# Patient Record
Sex: Female | Born: 1970 | Race: White | Hispanic: No | Marital: Married | Smoking: Current every day smoker
Health system: Southern US, Community
[De-identification: ages and names within clinical notes are randomized; demographics above are authoritative.]

## PROBLEM LIST (undated history)

## (undated) DIAGNOSIS — F419 Anxiety disorder, unspecified: Secondary | ICD-10-CM

## (undated) DIAGNOSIS — I10 Essential (primary) hypertension: Secondary | ICD-10-CM

## (undated) DIAGNOSIS — G919 Hydrocephalus, unspecified: Secondary | ICD-10-CM

## (undated) DIAGNOSIS — E7800 Pure hypercholesterolemia, unspecified: Secondary | ICD-10-CM

## (undated) DIAGNOSIS — E119 Type 2 diabetes mellitus without complications: Secondary | ICD-10-CM

## (undated) HISTORY — PX: TONSILLECTOMY: SUR1361

## (undated) HISTORY — DX: Pure hypercholesterolemia, unspecified: E78.00

## (undated) HISTORY — DX: Type 2 diabetes mellitus without complications: E11.9

## (undated) HISTORY — PX: OTHER SURGICAL HISTORY: SHX169

## (undated) HISTORY — DX: Anxiety disorder, unspecified: F41.9

## (undated) HISTORY — PX: CHOLECYSTECTOMY: SHX55

---

## 2001-12-02 ENCOUNTER — Inpatient Hospital Stay (HOSPITAL_COMMUNITY): Admission: AD | Admit: 2001-12-02 | Discharge: 2001-12-05 | Payer: Self-pay | Admitting: Obstetrics and Gynecology

## 2002-01-22 ENCOUNTER — Other Ambulatory Visit: Admission: RE | Admit: 2002-01-22 | Discharge: 2002-01-22 | Payer: Self-pay | Admitting: Obstetrics and Gynecology

## 2004-03-07 ENCOUNTER — Emergency Department (HOSPITAL_COMMUNITY): Admission: EM | Admit: 2004-03-07 | Discharge: 2004-03-07 | Payer: Self-pay | Admitting: *Deleted

## 2005-03-14 ENCOUNTER — Emergency Department (HOSPITAL_COMMUNITY): Admission: EM | Admit: 2005-03-14 | Discharge: 2005-03-14 | Payer: Self-pay | Admitting: Emergency Medicine

## 2005-05-11 ENCOUNTER — Ambulatory Visit (HOSPITAL_COMMUNITY): Admission: AD | Admit: 2005-05-11 | Discharge: 2005-05-11 | Payer: Self-pay | Admitting: Obstetrics and Gynecology

## 2005-06-25 ENCOUNTER — Observation Stay (HOSPITAL_COMMUNITY): Admission: RE | Admit: 2005-06-25 | Discharge: 2005-06-26 | Payer: Self-pay | Admitting: Obstetrics and Gynecology

## 2005-07-02 ENCOUNTER — Inpatient Hospital Stay (HOSPITAL_COMMUNITY): Admission: AD | Admit: 2005-07-02 | Discharge: 2005-07-04 | Payer: Self-pay | Admitting: Obstetrics and Gynecology

## 2008-03-13 ENCOUNTER — Encounter: Payer: Self-pay | Admitting: Obstetrics & Gynecology

## 2008-03-13 ENCOUNTER — Inpatient Hospital Stay (HOSPITAL_COMMUNITY): Admission: RE | Admit: 2008-03-13 | Discharge: 2008-03-16 | Payer: Self-pay | Admitting: Obstetrics & Gynecology

## 2009-04-06 ENCOUNTER — Other Ambulatory Visit: Admission: RE | Admit: 2009-04-06 | Discharge: 2009-04-06 | Payer: Self-pay | Admitting: Obstetrics & Gynecology

## 2010-09-06 DIAGNOSIS — R209 Unspecified disturbances of skin sensation: Secondary | ICD-10-CM | POA: Insufficient documentation

## 2010-10-25 NOTE — Op Note (Signed)
Brittney Reyes, Brittney Reyes               ACCOUNT NO.:  [id number]   MEDICAL RECORD NO.:  [id number]          PATIENT TYPE:  INP   LOCATION:  9107                          FACILITY:  [facility]   PHYSICIAN:  Lazaro Arms, M.D.   DATE OF BIRTH:  August 30, 1970   DATE OF PROCEDURE:  03/13/2008  DATE OF DISCHARGE:                               OPERATIVE REPORT   PREOPERATIVE DIAGNOSES:  1. Intrauterine pregnancy at 51 weeks' gestation.  2. Class A2 diabetes mellitus, on metformin.  3. Previous shoulder dystocia.  4. Estimated fetal weight is 4000 g.  5. Desire sterilization.   POSTOPERATIVE DIAGNOSES:  1. Intrauterine pregnancy at 34 weeks' gestation.  2. Class A2 diabetes mellitus, on metformin.  3. Previous shoulder dystocia.  4. Estimated fetal weight is 4000 g.  5. Desire sterilization.   PROCEDURE:  Primary low-transverse cesarean section.   SURGEON:  Lazaro Arms, MD   ANESTHESIA:  Spinal.   FINDINGS:  Over a low-transverse hysterotomy incision, was delivered a  viable female infant at 83 with Apgars of 8 and 9, weighing 8 pounds 7  ounces, cord gas 7.32.  We did do a tubal ligation.  Uterus, tubes, and  ovaries were normal.   DESCRIPTION OF OPERATION:  The patient was taken to operating room,  placed in a sitting position, where she underwent a spinal anesthetic.  She was then placed in the supine position with a tilt to the left.  She  was prepped and draped in the usual sterile fashion.  A Foley catheter  was placed.  A Pfannenstiel skin incision was made and carried down  sharply into the rectus fascia, which was scored in midline and extended  laterally.  The fascia was taken off the muscles superiorly and  inferiorly without difficulty, the muscles were divided.  The peritoneal  cavity was entered.  A bladder blade was placed.  A vesicouterine  serosal flap was created.  A low-transverse hysterotomy incision was  made over the incision and was delivered a viable female infant at  76  with Apgars of 8 and 9, weighing 8 pounds 7 ounces, and 3-vessel cord.  Cord blood and cord gas were sent.  Cords gas was 7.32.  The placenta  was delivered spontaneously.  The uterus was exteriorized, wiped clean  with a clean lap pad.  The uterus was closed in 2 layers, first being  the running-interlocking layer and the second being imbricating layer.  Modified Pomeroy bilateral tubal ligation was performed bilaterally with  2-cm segments removed bilaterally with good hemostasis.  The uterus was  replaced in peritoneal cavity and both tubal segments removal sites were  found to be hemostatic.  The pelvis was irrigated vigorously.  The  muscles reapproximated loosely.  The fascia was closed using 0-Vicryl  running.  Subcutaneous tissue was made hemostatic and irrigated.  The  skin was closed using 4-0 Vicryl in  subcuticular fashion and Dermabond.  The patient tolerated the procedure  well.  She experienced 750 mL of blood loss, taken to the recovery room  in good stable condition.  All counts  were correct.  She received a gram  of Ancef after the cord was clamped.      Lazaro Arms, M.D.  Electronically Signed     LHE/MEDQ  D:  03/13/2008  T:  03/14/2008  Job:  [id number]

## 2010-10-28 NOTE — Op Note (Signed)
[facility]  Patient:    Brittney Reyes, Brittney Reyes Visit Number: [id number] MRN: [id number]          Service Type: OBS Location: 4A A417 01 Attending Physician:  Tilda Burrow Dictated by:   Zerita Boers, C.N.M. Proc. Date: 12/03/01 Admit Date:  12/02/2001   CC:         [facility]   Operative Report  DELIVERY SUMMARY  ONSET OF LABOR:  December 03, 2001, at 6:18 a.m.  DATE OF DELIVERY:  December 03, 2001, at 11:33 a.m.  LENGTH OF FIRST STAGE OF LABOR:  5 hours and 20 minutes.  LENGTH OF SECOND STAGE OF LABOR:  13 minutes.  LENGTH OF THIRD STAGE OF LABOR:  2 minutes.  DELIVERY NOTE:  The patient had a normal spontaneous vaginal delivery of a viable female infant.  Upon delivery of the head, a nuchal head was noted which was easily loosened and the infant slipped through without any complications. Following delivery of the infant, the infant was active with good tone, picked up well, and had a strong lusty cry with stimulation.  Apgars were 9 and 9. Upon inspection, the perineum was intact.  Pitocin 20 U/ml and D5 LR 1000 cc were run rapidly to actively manage the third stage of labor.  The placenta was delivered spontaneously with a Tomasa Blase mechanism with membranes intact upon inspection.  A three-vessel cord was noted.  The estimated blood loss was approximately 350 cc.  The epidural catheter was removed with the blue tip intact.  The infant and mother tolerated the procedure well.  Both were stabilized and transferred out to the postpartum unit in stable condition. Dictated by:   Zerita Boers, C.N.M. Attending Physician:  Tilda Burrow DD:  12/03/01 TD:  12/03/01 Job: [id number]

## 2010-10-28 NOTE — H&P (Signed)
[facility]  Patient:    Brittney Reyes, Brittney Reyes Visit Number: [id number] MRN: [id number]          Service Type: OBS Location: 4A A417 01 Attending Physician:  Tilda Burrow Dictated by:   Zerita Boers, C.N.M. Admit Date:  12/02/2001   CC:         [facility]   History and Physical  DATE OF BIRTH:  [birth year]  ADMITTING DIAGNOSIS:  Pregnancy at 39 weeks and two days, with maternal fatigue and discomfort; elective induction of labor.  HISTORY OF PRESENT ILLNESS:  [name] is a 40 year old gravida 4 para 3, with a consistent EDC of December 07, 2001 by dates and early ultrasound, who over the past several weeks has been plagued with prodromal labor, insomnia, and maternal fatigue and exhaustion.  She has been requesting induction of labor x2 weeks and I discussed with her at length the risks of induction versus benefits and she has verbalized understanding of this and wishes to proceed with induction of labor.  PAST MEDICAL HISTORY:  Positive for hydrocephaly at age 66.  PAST SURGICAL HISTORY:  Positive for a shunt in 1991 for above.  ALLERGIES:  PROZAC.  MEDICATIONS:  Prenatal vitamins.  SOCIAL HISTORY:  She is married.  Her husband is supportive  FAMILY HISTORY:  Positive for cancer, diabetes, high blood pressure.  PRENATAL COURSE:  Has been complicated by abnormal Pap smear, for which she had colposcopy at 12-14 weeks, and she is to be rechecked three months after delivery.  She also has a history of gestational diabetes but her glucose tolerance tests have been within normal limits.  Blood type is A-negative. Rubella immune.  Hepatitis B surface antigen negative.  HIV negative.  Pap smear was normal.  It showed ascus.  She had colposcopy.  GC and Chlamydia are both negative.  AFP is within normal limits.  She does have a positive GBS status.  PHYSICAL EXAMINATION:  VITAL SIGNS:  Today weight is 213 pounds.  Blood pressure 124/76.   Fetal heart rate is 130, strong, and regular.  Fundal height is 32 cm.  PELVIC:  The cervix is 1 cm thick, posterior, and soft.  PLAN:  We are going to admit her for Foley bulb induction of labor on December 02, 2001 at 5 p.m. Dictated by:   Zerita Boers, C.N.M. Attending Physician:  Tilda Burrow DD:  12/02/01 TD:  12/02/01 Job: [id number]

## 2010-10-28 NOTE — Op Note (Signed)
[facility]  Patient:    DENESE, MENTINK Visit Number: [id number] MRN: [id number]          Service Type: OBS Location: 4A A417 01 Attending Physician:  Tilda Burrow Dictated by:   Christin Bach, M.D. Proc. Date: 12/03/01 Admit Date:  12/02/2001 Discharge Date: 12/05/2001                             Operative Report  PROCEDURE:  Epidural catheter placement.  SURGEON:  Christin Bach, M.D.  TIME:  8:45, December 03, 2001  DESCRIPTION OF PROCEDURE:  The patient had obtained consent, 500 cc lactated Ringers bolus infused, and the patient placed in the sitting position where loss of resistance technique was used after prepping and draping the back. The L3-4 interspace was used on the first try to identify the epidural space using a 19-gauge Tuohy needle.  A 5 cc test dose of 1.5% Xylocaine with epinephrine was instilled followed by a bolus of epidural solution 0.125% Marcaine, also 5 cc in volume.  The continuous infusion was then at 12 cc per hour beginning at 9:15 a.m.  The patient had excellent analgesic relief from this.  Catheter was taped to the back and used in the standard fashion without difficulty. Dictated by:   Christin Bach, M.D. Attending Physician:  Tilda Burrow DD:  12/18/01 TD:  12/22/01 Job: [id number]

## 2010-10-28 NOTE — Discharge Summary (Signed)
Brittney Reyes, Brittney Reyes               ACCOUNT NO.:  [id number]   MEDICAL RECORD NO.:  [id number]          PATIENT TYPE:  INP   LOCATION:  9107                          FACILITY:  [facility]   PHYSICIAN:  Lazaro Arms, M.D.   DATE OF BIRTH:  [birth year]   DATE OF ADMISSION:  03/13/2008  DATE OF DISCHARGE:  03/16/2008                               DISCHARGE SUMMARY   DISCHARGE DIAGNOSES:  1. Status post primary cesarean section and tubal ligation.  2. Unremarkable postoperative course.   PROCEDURES:  Primary cesarean section and tubal ligation.   Please refer to the antepartum chart, operative report, and history and  physical for details of admission to the hospital.   HOSPITAL COURSE:  The patient was admitted for surgery.  She underwent a  primary cesarean section.  She had a previous 4000-g baby and had a  shoulder dystocia which was significant and she was not a diabetic with  that pregnancy.  She was diabetic with this pregnancy.  Estimated fetal  weight was 4000 g.  Fetal vertex was out of the pelvis even though she  had multiple children vaginally and discussed it over with the patient  and her husband.  We decided to pursue with a primary C-section to try  to avoid shoulder dystocia going forward.  The patient understands and  fully agreed for C-section and tubal ligation at patient's request which  went without difficulty.  Her postop course was unremarkable.  She  tolerated clear liquids and regular diet.  She voided without symptoms.  She was extensively ambulatory.  Her abdominal exam was benign.  Her  incision was clean, dry, and intact.  Her hemoglobin and hematocrit  postop were stable, a little lower than anticipated but stable.  She was  tolerating all her pain medicines.  She was discharged to home on postop  day #3 in good stable condition to follow up in the office next week to  have her incision evaluated.  She was given instructions and precautions  to return, Motrin  and Percocet for pain.      Lazaro Arms, M.D.  Electronically Signed     LHE/MEDQ  D:  04/22/2008  T:  04/23/2008  Job:  [id number]

## 2010-10-28 NOTE — H&P (Signed)
NAMEZALIAH, WISSNER               ACCOUNT NO.:  [id number]   MEDICAL RECORD NO.:  [id number]          PATIENT TYPE:  INP   LOCATION:  A403                          FACILITY:  [facility]   PHYSICIAN:  Tilda Burrow, M.D. DATE OF BIRTH:  1971/04/08   DATE OF ADMISSION:  DATE OF DISCHARGE:  LH                                HISTORY & PHYSICAL   ADMISSION DIAGNOSES:  1.  Pregnancy 38 weeks and 3 days.  2.  Medically indicated induction due to transportation concerns with      history of rapid labor.   HISTORY OF PRESENT ILLNESS:  This is a 40 year old female, gravida 5, para  0, four living children, who lives in [place], [place], one hour and  20 minutes from here.  Is admitted on Sunday morning for elective induction  of labor due to history of rapid precipitous labors. She had one baby in 45  minutes from the initiation of contractions.  Cervix is 1-2 cm, 20%, -2 but  she has intermittent pressure.  We have talked about options and with her  schedule and limited family support, Sunday is the best option.  She knows  to call before coming to labor and delivery that morning.  All the usual  risks of labor and delivery including need for emergency delivery can occur  with elective labors and she understands this.   PAST SURGICAL HISTORY:  Benign surgical history.   PAST MEDICAL HISTORY:  1.  Benign other than mildly elevated blood pressure in the third trimester,      currently 130/80 with negative protein.  2.  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   PAST OB HISTORY:  1.  Also notable for history of group B strep positive status with third      child.  2.  She had an epidural with first two.   ALLERGIES:  PROZAC.   MEDICATIONS:  1.  Vitamin D.  2.  Prenatal vitamins.   PHYSICAL EXAMINATION:  VITAL SIGNS:  Height 5 feet 6 inches.  Weight 221.  Blood pressure 130/80.  GENERAL:  Pregnancy  adequately supported by husband.  Family history notable  for mother who has medical  obligations for most of next week.  Term size fetus, vertex presentation.  PELVIC:  Cervix 1-2, 20%, -2, very soft and favorable in texture.   PLAN:  Pitocin induction on Sunday, January 21.      Tilda Burrow, M.D.  Electronically Signed     JVF/MEDQ  D:  06/30/2005  T:  06/30/2005  Job:  [id number]

## 2010-10-28 NOTE — Op Note (Signed)
NAMESHERREL, Brittney Reyes               ACCOUNT NO.:  [id number]   MEDICAL RECORD NO.:  [id number]          PATIENT TYPE:  INP   LOCATION:  A403                          FACILITY:  [facility]   PHYSICIAN:  Tilda Burrow, M.D. DATE OF BIRTH:  11-20-1970   DATE OF PROCEDURE:  07/02/2005  DATE OF DISCHARGE:                                 OPERATIVE REPORT   LABOR SUMMARY AND EPIDURAL NOTE:  Brittney Reyes progressed slowly in labor.  Her  admission was delayed until 9 a.m. due to staffing concerns and business of  labor and delivery.  Upon arrival she was fingertip, very posterior, long  and uneffaced.  Vertex presentation was confirmed.  Amniotomy was performed  after she had been on Pitocin until approximately 3 p.m.  Cervix was only a  finger tip at that point.  She went about 2 hours and then developed  sensation and discomfort and some pelvic pressure.  She was checked and  found to be 5 cm.  Plans for epidural were rapidly initiated.   The patient was placed in the sitting position and continuous lumbar  epidural attempted using loss of resistance technique.  On the second  attempt at L2-3 interspace we were able to identify the epidural space.  The  patient was quite uncomfortable with the contractions and in the sitting  position made things worse.  She developed an urge to push so as soon as we  were able to give the initial 7 mL test dose of 1.5% lidocaine with  epinephrine, the patient had the needle removed.  We did not insert the  catheter, the patient was placed in the supine position and confirmed as  being completely dilated.   DELIVERY NOTE:  The patient then progressed to initially quite  uncomfortable, but she became slightly more comfortable as the epidural  effect improved.  She then was able to push the baby out in approximately 10  minutes delivering over a direct occiput anterior, delivering a healthy,  female infant, 8 pounds 0.6 ounces.  Apgars of 8 and 9.  The placenta  was  delivered easily, Tomasa Blase presentation after cord blood samples were  obtained.  The patient did have one episode of uterine atony, and had an  additional 400 mL of blood loss, total was estimated at probably 600-800 mL  total. The patient remained hemodynamically stable, thereafter received  Hemabate 125 mcg intramuscular.  She received IV oxytocin x1 liter over a 2  hour infusion.  Uterine tone remained excellent at U minus 3. There were no  lacerations requiring repair.      Tilda Burrow, M.D.  Electronically Signed     JVF/MEDQ  D:  07/02/2005  T:  07/03/2005  Job:  [id number]   cc:   [facility]  [place]   [place]  [id number]

## 2011-03-14 LAB — COMPREHENSIVE METABOLIC PANEL
ALT: 13
AST: 16
Calcium: 8.4
Creatinine, Ser: 0.6
GFR calc Af Amer: >60
Sodium: 136
Total Protein: 6.5

## 2011-03-14 LAB — GLUCOSE, CAPILLARY
Glucose-Capillary: 124 — ABNORMAL HIGH
Glucose-Capillary: 147 — ABNORMAL HIGH
Glucose-Capillary: 155 — ABNORMAL HIGH
Glucose-Capillary: 78
Glucose-Capillary: 86

## 2011-03-14 LAB — CBC
HCT: 26.5 — ABNORMAL LOW
MCHC: 32.8
MCHC: 33.3
MCV: 98.1
MCV: 98.2
Platelets: 222
RDW: 16.1 — ABNORMAL HIGH

## 2011-03-14 LAB — RPR: RPR Ser Ql: NONREACTIVE

## 2011-03-14 LAB — RH IMMUNE GLOB WKUP(>/=20WKS)(NOT WOMEN'S HOSP)

## 2011-08-28 DIAGNOSIS — G911 Obstructive hydrocephalus: Secondary | ICD-10-CM | POA: Insufficient documentation

## 2011-09-25 DIAGNOSIS — H539 Unspecified visual disturbance: Secondary | ICD-10-CM | POA: Insufficient documentation

## 2011-09-25 DIAGNOSIS — R519 Headache, unspecified: Secondary | ICD-10-CM | POA: Insufficient documentation

## 2012-03-25 ENCOUNTER — Other Ambulatory Visit (HOSPITAL_COMMUNITY)
Admission: RE | Admit: 2012-03-25 | Discharge: 2012-03-25 | Disposition: A | Discharge status: Home / Self Care | Payer: Medicaid Other | Source: Ambulatory Visit | Attending: Obstetrics & Gynecology | Admitting: Obstetrics & Gynecology

## 2012-03-25 ENCOUNTER — Other Ambulatory Visit: Payer: Self-pay | Admitting: Obstetrics & Gynecology

## 2012-03-25 DIAGNOSIS — Z01419 Encounter for gynecological examination (general) (routine) without abnormal findings: Secondary | ICD-10-CM | POA: Insufficient documentation

## 2013-05-15 ENCOUNTER — Encounter: Payer: Self-pay | Admitting: Obstetrics & Gynecology

## 2013-05-15 ENCOUNTER — Other Ambulatory Visit (HOSPITAL_COMMUNITY)
Admission: RE | Admit: 2013-05-15 | Discharge: 2013-05-15 | Disposition: A | Discharge status: Home / Self Care | Payer: Medicaid Other | Source: Ambulatory Visit | Attending: Obstetrics & Gynecology | Admitting: Obstetrics & Gynecology

## 2013-05-15 ENCOUNTER — Ambulatory Visit (INDEPENDENT_AMBULATORY_CARE_PROVIDER_SITE_OTHER): Payer: Medicaid Other | Admitting: Obstetrics & Gynecology

## 2013-05-15 VITALS — BP 120/70 | Ht 66.2 in | Wt 176.0 lb

## 2013-05-15 DIAGNOSIS — F329 Major depressive disorder, single episode, unspecified: Secondary | ICD-10-CM

## 2013-05-15 DIAGNOSIS — Z124 Encounter for screening for malignant neoplasm of cervix: Secondary | ICD-10-CM

## 2013-05-15 DIAGNOSIS — Z01419 Encounter for gynecological examination (general) (routine) without abnormal findings: Secondary | ICD-10-CM | POA: Insufficient documentation

## 2013-05-15 DIAGNOSIS — Z1151 Encounter for screening for human papillomavirus (HPV): Secondary | ICD-10-CM | POA: Insufficient documentation

## 2013-05-15 DIAGNOSIS — I10 Essential (primary) hypertension: Secondary | ICD-10-CM | POA: Insufficient documentation

## 2013-05-15 DIAGNOSIS — Z0000 Encounter for general adult medical examination without abnormal findings: Secondary | ICD-10-CM

## 2013-05-15 MED ORDER — HYDROCORTISONE ACE-PRAMOXINE 1-1 % RE FOAM: 1.0000 | Freq: Two times a day (BID) | RECTAL | Status: DC

## 2013-05-15 NOTE — Progress Notes (Signed)
Patient ID: Brittney Reyes, female   DOB: 12/06/1970, 42 y.o.   MRN: [id number] Subjective:     Brittney Reyes is a 42 y.o. female here for a routine exam.  Patient's last menstrual period was 04/22/2013. No obstetric history on file. Birth Control Method:  btl Menstrual Calendar(currently): regular 4 days, moderate minimal cramps  Current complaints: none.   Current acute medical issues:  none   Recent Gynecologic History Patient's last menstrual period was 04/22/2013. Last Pap: 2013,  normal Last mammogram: na,  na  Past Medical History  Diagnosis Date  . Anxiety     Past Surgical History  Procedure Laterality Date  . Cesarean section    . Leep surgery    . Cholecystectomy    . Shut in stomach      OB History   Grav Para Term Preterm Abortions TAB SAB Ect Mult Living                  History   Social History  . Marital Status: Married    Spouse Name: N/A    Number of Children: N/A  . Years of Education: N/A   Social History Main Topics  . Smoking status: Current Every Day Smoker  . Smokeless tobacco: None  . Alcohol Use: None  . Drug Use: None  . Sexual Activity: None   Other Topics Concern  . None   Social History Narrative  . None    Family History  Problem Relation Age of Onset  . Diabetes Mother   . Hypertension Mother   . Hypertension Father   . Migraines Daughter   . Asthma Child      Review of Systems  Review of Systems  Constitutional: Negative for fever, chills, weight loss, malaise/fatigue and diaphoresis.  HENT: Negative for hearing loss, ear pain, nosebleeds, congestion, sore throat, neck pain, tinnitus and ear discharge.   Eyes: Negative for blurred vision, double vision, photophobia, pain, discharge and redness.  Respiratory: Negative for cough, hemoptysis, sputum production, shortness of breath, wheezing and stridor.   Cardiovascular: Negative for chest pain, palpitations, orthopnea, claudication, leg swelling and PND.   Gastrointestinal: negative for abdominal pain. Negative for heartburn, nausea, vomiting, diarrhea, constipation, blood in stool and melena.  Genitourinary: Negative for dysuria, urgency, frequency, hematuria and flank pain.  Musculoskeletal: Negative for myalgias, back pain, joint pain and falls.  Skin: Negative for itching and rash.  Neurological: Negative for dizziness, tingling, tremors, sensory change, speech change, focal weakness, seizures, loss of consciousness, weakness and headaches.  Endo/Heme/Allergies: Negative for environmental allergies and polydipsia. Does not bruise/bleed easily.  Psychiatric/Behavioral: Negative for depression, suicidal ideas, hallucinations, memory loss and substance abuse. The patient is not nervous/anxious and does not have insomnia.        Objective:    Physical Exam  Vitals reviewed. Constitutional: She is oriented to person, place, and time. She appears well-developed and well-nourished.  HENT:  Head: Normocephalic and atraumatic.        Right Ear: External ear normal.  Left Ear: External ear normal.  Nose: Nose normal.  Mouth/Throat: Oropharynx is clear and moist.  Eyes: Conjunctivae and EOM are normal. Pupils are equal, round, and reactive to light. Right eye exhibits no discharge. Left eye exhibits no discharge. No scleral icterus.  Neck: Normal range of motion. Neck supple. No tracheal deviation present. No thyromegaly present.  Cardiovascular: Normal rate, regular rhythm, normal heart sounds and intact distal pulses.  Exam reveals no gallop and no  friction rub.   No murmur heard. Respiratory: Effort normal and breath sounds normal. No respiratory distress. She has no wheezes. She has no rales. She exhibits no tenderness.  GI: Soft. Bowel sounds are normal. She exhibits no distension and no mass. There is no tenderness. There is no rebound and no guarding.  Genitourinary:  Breasts no masses skin changes or nipple changes bilaterally       Vulva is normal without lesions Vagina is pink moist without discharge Cervix normal in appearance and pap is done Uterus is normal size shape and contour Adnexa is negative with normal sized ovaries   Musculoskeletal: Normal range of motion. She exhibits no edema and no tenderness.  Neurological: She is alert and oriented to person, place, and time. She has normal reflexes. She displays normal reflexes. No cranial nerve deficit. She exhibits normal muscle tone. Coordination normal.  Skin: Skin is warm and dry. No rash noted. No erythema. No pallor.  Psychiatric: She has a normal mood and affect. Her behavior is normal. Judgment and thought content normal.       Assessment:    Healthy female exam.    Plan:    Follow up in: 1 years.

## 2013-06-21 ENCOUNTER — Emergency Department (HOSPITAL_COMMUNITY): Payer: Medicaid Other

## 2013-06-21 ENCOUNTER — Emergency Department (HOSPITAL_COMMUNITY)
Admission: EM | Admit: 2013-06-21 | Discharge: 2013-06-21 | Disposition: A | Discharge status: Home / Self Care | Payer: Medicaid Other | Attending: Emergency Medicine | Admitting: Emergency Medicine

## 2013-06-21 ENCOUNTER — Encounter (HOSPITAL_COMMUNITY): Payer: Self-pay | Admitting: Emergency Medicine

## 2013-06-21 DIAGNOSIS — F411 Generalized anxiety disorder: Secondary | ICD-10-CM | POA: Insufficient documentation

## 2013-06-21 DIAGNOSIS — Z3202 Encounter for pregnancy test, result negative: Secondary | ICD-10-CM | POA: Insufficient documentation

## 2013-06-21 DIAGNOSIS — R109 Unspecified abdominal pain: Secondary | ICD-10-CM | POA: Insufficient documentation

## 2013-06-21 DIAGNOSIS — F17200 Nicotine dependence, unspecified, uncomplicated: Secondary | ICD-10-CM | POA: Insufficient documentation

## 2013-06-21 DIAGNOSIS — I10 Essential (primary) hypertension: Secondary | ICD-10-CM | POA: Insufficient documentation

## 2013-06-21 DIAGNOSIS — Z79899 Other long term (current) drug therapy: Secondary | ICD-10-CM | POA: Insufficient documentation

## 2013-06-21 DIAGNOSIS — Z8669 Personal history of other diseases of the nervous system and sense organs: Secondary | ICD-10-CM | POA: Insufficient documentation

## 2013-06-21 HISTORY — DX: Hydrocephalus, unspecified: G91.9

## 2013-06-21 HISTORY — DX: Essential (primary) hypertension: I10

## 2013-06-21 LAB — CBC WITH DIFFERENTIAL/PLATELET
BASOS ABS: 0 10*3/uL (ref 0.0–0.1)
BASOS PCT: 0 % (ref 0–1)
EOS ABS: 0 10*3/uL (ref 0.0–0.7)
EOS PCT: 0 % (ref 0–5)
HCT: 35.6 % — ABNORMAL LOW (ref 36.0–46.0)
Hemoglobin: 11.8 g/dL — ABNORMAL LOW (ref 12.0–15.0)
LYMPHS PCT: 29 % (ref 12–46)
Lymphs Abs: 2.3 10*3/uL (ref 0.7–4.0)
MCH: 29.3 pg (ref 26.0–34.0)
MCHC: 33.1 g/dL (ref 30.0–36.0)
MCV: 88.3 fL (ref 78.0–100.0)
Monocytes Absolute: 0.4 10*3/uL (ref 0.1–1.0)
Monocytes Relative: 5 % (ref 3–12)
Neutro Abs: 5 10*3/uL (ref 1.7–7.7)
Neutrophils Relative %: 65 % (ref 43–77)
PLATELETS: 404 10*3/uL — AB (ref 150–400)
RBC: 4.03 MIL/uL (ref 3.87–5.11)
RDW: 14.5 % (ref 11.5–15.5)
WBC: 7.7 10*3/uL (ref 4.0–10.5)

## 2013-06-21 LAB — HCG, QUANTITATIVE, PREGNANCY

## 2013-06-21 LAB — COMPREHENSIVE METABOLIC PANEL
ALBUMIN: 4.5 g/dL (ref 3.5–5.2)
ALT: 9 U/L (ref 0–35)
AST: 11 U/L (ref 0–37)
Alkaline Phosphatase: 76 U/L (ref 39–117)
BUN: 6 mg/dL (ref 6–23)
CALCIUM: 9.5 mg/dL (ref 8.4–10.5)
CO2: 26 meq/L (ref 19–32)
Chloride: 99 mEq/L (ref 96–112)
Creatinine, Ser: 0.54 mg/dL (ref 0.50–1.10)
GFR calc Af Amer: >90 mL/min (ref >90)
GFR calc non Af Amer: >90 mL/min (ref >90)
Glucose, Bld: 106 mg/dL — ABNORMAL HIGH (ref 70–99)
Potassium: 4.4 mEq/L (ref 3.7–5.3)
SODIUM: 138 meq/L (ref 137–147)
TOTAL PROTEIN: 8.1 g/dL (ref 6.0–8.3)
Total Bilirubin: 0.2 mg/dL — ABNORMAL LOW (ref 0.3–1.2)

## 2013-06-21 LAB — URINALYSIS, ROUTINE W REFLEX MICROSCOPIC
BILIRUBIN URINE: NEGATIVE
GLUCOSE, UA: NEGATIVE mg/dL
HGB URINE DIPSTICK: NEGATIVE
Ketones, ur: NEGATIVE mg/dL
Leukocytes, UA: NEGATIVE
Nitrite: NEGATIVE
PH: 7 (ref 5.0–8.0)
Protein, ur: NEGATIVE mg/dL
Urobilinogen, UA: 0.2 mg/dL (ref 0.0–1.0)

## 2013-06-21 LAB — PREGNANCY, URINE: PREG TEST UR: NEGATIVE

## 2013-06-21 MED ORDER — SODIUM CHLORIDE 0.9 % IV BOLUS (SEPSIS)
500.0000 mL | Freq: Once | INTRAVENOUS | Status: AC
  Administered 2013-06-21: 500 mL via INTRAVENOUS

## 2013-06-21 MED ORDER — DICYCLOMINE HCL 20 MG PO TABS: ORAL_TABLET | ORAL | Status: DC

## 2013-06-21 MED ORDER — HYDROMORPHONE HCL PF 1 MG/ML IJ SOLN
0.5000 mg | Freq: Once | INTRAMUSCULAR | Status: AC
  Administered 2013-06-21: 0.5 mg via INTRAVENOUS
  Filled 2013-06-21: qty 1

## 2013-06-21 MED ORDER — ONDANSETRON HCL 4 MG/2ML IJ SOLN
4.0000 mg | Freq: Once | INTRAMUSCULAR | Status: AC
Start: 2013-06-21 — End: 2013-06-21
  Administered 2013-06-21: 4 mg via INTRAVENOUS
  Filled 2013-06-21: qty 2

## 2013-06-21 MED ORDER — IOHEXOL 300 MG/ML  SOLN
100.0000 mL | Freq: Once | INTRAMUSCULAR | Status: AC | PRN
  Administered 2013-06-21: 100 mL via INTRAVENOUS

## 2013-06-21 MED ORDER — IOHEXOL 300 MG/ML  SOLN
50.0000 mL | Freq: Once | INTRAMUSCULAR | Status: AC | PRN
  Administered 2013-06-21: 50 mL via ORAL

## 2013-06-21 NOTE — Discharge Instructions (Signed)
Follow up with dr. Jena Gaussrourk in 1-2 weeks

## 2013-06-21 NOTE — ED Notes (Signed)
Returned from CT.

## 2013-06-21 NOTE — ED Notes (Addendum)
Patient completed Omnipaque. CT notified.

## 2013-06-21 NOTE — ED Provider Notes (Signed)
CSN: [id number]     Arrival date & time 06/21/13  1118 History  This chart was scribed for Benny LennertJoseph L Oliviarose Punch, MD by Bennett Scrapehristina Taylor, ED Scribe. This patient was seen in room APA09/APA09 and the patient's care was started at 12:11 PM.   Chief Complaint  Patient presents with  . Abdominal Pain    Patient is a 43 y.o. female presenting with flank pain. The history is provided by the patient. No language interpreter was used.  Flank Pain This is a new problem. The current episode started more than 1 week ago. The problem occurs constantly. The problem has been gradually worsening. Associated symptoms include abdominal pain. Pertinent negatives include no chest pain, no headaches and no shortness of breath. Nothing aggravates the symptoms. Nothing relieves the symptoms. She has tried nothing for the symptoms.    HPI Comments: Brittney Reyes is a 43 y.o. female who presents to the Emergency Department complaining of 3 weeks of right flank pain that radiates into the right abdomen with associated nausea. She denies any emesis. She states that she saw Dr. Lanier PrudeBolden this morning for the same and was told to come to the ED due to the possibility of "appendicitis or a tubal pregnancy". A urine pregnancy test was performed in office that was negative. Pt does report having a false negative during her last pregnancy during which she had to take progesterone.    Past Medical History  Diagnosis Date  . Anxiety   . Hydrocephalus   . Hypertension    Past Surgical History  Procedure Laterality Date  . Cesarean section    . Leep surgery    . Cholecystectomy    . 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 Tonsillectomy     Family History  Problem Relation Age of Onset  . Diabetes Mother   . Hypertension Mother   . Hypertension Father   . Migraines Daughter   . Asthma Child    History  Substance Use Topics  . Smoking status: Current Every Day Smoker -- 0.50 packs/day for 25 years    Types:  Cigarettes  . Smokeless tobacco: Never Used  . Alcohol Use: No   No OB history provided.  Review of Systems  Constitutional: Negative for fever and appetite change.  HENT: Negative for congestion, ear discharge and sinus pressure.   Eyes: Negative for discharge.  Respiratory: Negative for shortness of breath.   Cardiovascular: Negative for chest pain.  Gastrointestinal: Positive for nausea and abdominal pain. Negative for vomiting.  Genitourinary: Positive for flank pain. Negative for frequency.  Musculoskeletal: Negative for back pain.  Skin: Negative for rash.  Neurological: Negative for seizures and headaches.  Psychiatric/Behavioral: Negative for hallucinations.    Allergies  Prozac and Wellbutrin  Home Medications   Current Outpatient Rx  Name  Route  Sig  Dispense  Refill  . amLODipine (NORVASC) 5 MG tablet   Oral   Take 5 mg by mouth daily.         . calcium-vitamin D (OSCAL WITH D) 500-200 MG-UNIT per tablet   Oral   Take 2,000 tablets by mouth.         . hydrocortisone-pramoxine (PROCTOFOAM HC) rectal foam   Rectal   Place 1 applicator rectally 2 (two) times daily.   10 g   11   . sertraline (ZOLOFT) 100 MG tablet   Oral   Take 100 mg by mouth daily.  Triage Vitals: BP 166/100  Pulse 63  Temp(Src) 97.7 F (36.5 C) (Oral)  Resp 18  Ht 5' 6.5" (1.689 m)  Wt 175 lb (79.379 kg)  BMI 27.83 kg/m2  SpO2 100%  LMP 04/23/2013  Physical Exam  Nursing note and vitals reviewed. Constitutional: She is oriented to person, place, and time. She appears well-developed and well-nourished.  HENT:  Head: Normocephalic and atraumatic.  Eyes: Conjunctivae and EOM are normal. No scleral icterus.  Neck: Neck supple. No thyromegaly present.  Cardiovascular: Normal rate and regular rhythm.  Exam reveals no gallop and no friction rub.   No murmur heard. Pulmonary/Chest: Effort normal and breath sounds normal. No stridor. She has no wheezes. She has no  rales. She exhibits no tenderness.  Abdominal: Soft. She exhibits no distension. There is tenderness (diffuse moderate tenderness, worse in the RUQ and RLQ). There is no rebound.  Musculoskeletal: Normal range of motion. She exhibits no edema.  Lymphadenopathy:    She has no cervical adenopathy.  Neurological: She is alert and oriented to person, place, and time. She exhibits normal muscle tone. Coordination normal.  Skin: Skin is warm and dry. No rash noted. No erythema.  Psychiatric: She has a normal mood and affect. Her behavior is normal.    ED Course  Procedures (including critical care time)  Medications  ondansetron (ZOFRAN) injection 4 mg (not administered)  sodium chloride 0.9 % bolus 500 mL (not administered)  HYDROmorphone (DILAUDID) injection 0.5 mg (not administered)    DIAGNOSTIC STUDIES: Oxygen Saturation is 100% on RA, normal by my interpretation.    COORDINATION OF CARE: 12:15 PM-Discussed treatment plan which includes medications, CT of abdomen, CBC panel, CMP and UA with pt at bedside and pt agreed to plan.   Labs Review Labs Reviewed - No data to display Imaging Review No results found.  EKG Interpretation   None       MDM  abd pain,  Nl studies, refer to gi The chart was scribed for me under my direct supervision.  I personally performed the history, physical, and medical decision making and all procedures in the evaluation of this patient.Benny Lennert, MD 06/21/13 [phone]

## 2013-06-21 NOTE — ED Notes (Signed)
Pt complaining of abdominal pain along with right flank pain x 3 weeks. Saw Dr. Lanier PrudeBolden this morning, told to come to emergency department for evaluation for tubal pregnancy or appendicitis. Negative urine pregnancy test, afebrile.

## 2013-07-01 ENCOUNTER — Ambulatory Visit (INDEPENDENT_AMBULATORY_CARE_PROVIDER_SITE_OTHER): Payer: Medicaid Other | Admitting: Obstetrics and Gynecology

## 2013-07-01 ENCOUNTER — Encounter: Payer: Self-pay | Admitting: Obstetrics and Gynecology

## 2013-07-01 VITALS — BP 158/60 | Ht 66.5 in | Wt 178.4 lb

## 2013-07-01 DIAGNOSIS — Z3202 Encounter for pregnancy test, result negative: Secondary | ICD-10-CM

## 2013-07-01 DIAGNOSIS — N898 Other specified noninflammatory disorders of vagina: Secondary | ICD-10-CM

## 2013-07-01 LAB — POCT WET PREP (WET MOUNT)
POCT FERN TEST: POSITIVE
TRICHOMONAS WET PREP HPF POC: NEGATIVE

## 2013-07-01 LAB — POCT URINE PREGNANCY: PREG TEST UR: NEGATIVE

## 2013-07-01 MED ORDER — MEDROXYPROGESTERONE ACETATE 10 MG PO TABS: 10.0000 mg | ORAL_TABLET | Freq: Every day | ORAL | Status: DC

## 2013-07-01 NOTE — Progress Notes (Signed)
Patient ID: Brittney HardingMichelle L Reyes, female   DOB: 08/10/1970, 43 y.o.   MRN: [id number]  Chief Complaint  Patient presents with  . Abdominal Pain    HPI Brittney HardingMichelle L Reyes is a 43 y.o. female.  She is seen here to sort out confusion regarding recent [facility] which suggested that she has thickened endometrium, and multiple small follicular cysts. Differential diagnosis was extensive and included molar pregnancy which caused anxiety the patient Abdominal Pain   LMP November Past Medical History  Diagnosis Date  . Anxiety   . Hydrocephalus   . Hypertension     Past Surgical History  Procedure Laterality Date  . Cesarean section    . Leep surgery    . Cholecystectomy    . 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 Tonsillectomy      Family History  Problem Relation Age of Onset  . Diabetes Mother   . Hypertension Mother   . Hypertension Father   . Migraines Daughter   . Asthma Child     Social History History  Substance Use Topics  . Smoking status: Current Every Day Smoker -- 0.50 packs/day for 25 years    Types: Cigarettes  . Smokeless tobacco: Never Used  . Alcohol Use: No    Allergies  Allergen Reactions  . Prozac [Fluoxetine Hcl] Nausea And Vomiting and Swelling  . Wellbutrin [Bupropion] Hives    Current Outpatient Prescriptions  Medication Sig Dispense Refill  . acetaminophen (TYLENOL) 500 MG tablet Take 1,000 mg by mouth daily as needed for headache.      Marland Kitchen. amLODipine (NORVASC) 5 MG tablet Take 5 mg by mouth daily.      . Cholecalciferol (HM VITAMIN D3) 2000 UNITS CAPS Take 1 capsule by mouth daily.      Marland Kitchen. dicyclomine (BENTYL) 20 MG tablet Take one every 6-8h as needed for abd cramps  30 tablet  0  . hydrocortisone-pramoxine (PROCTOFOAM HC) rectal foam Place 1 applicator rectally 2 (two) times daily.  10 g  11  . ibuprofen (ADVIL,MOTRIN) 200 MG tablet Take 800 mg by mouth daily as needed for headache.      . sertraline  (ZOLOFT) 100 MG tablet Take 50 mg by mouth daily.        No current facility-administered medications for this visit.    Review of Systems Review of Systems  Gastrointestinal: Positive for abdominal pain.    Blood pressure 158/60, height 5' 6.5" (1.689 m), weight 178 lb 6.4 oz (80.922 kg), last menstrual period 04/23/2013.  Physical Exam Physical Exam  Constitutional: She is oriented to person, place, and time. She appears well-developed and well-nourished.  Eyes: Pupils are equal, round, and reactive to light.  Neck: Neck supple. No thyromegaly present.  Abdominal: Soft. She exhibits no mass. There is no tenderness. There is no rebound.  Genitourinary: Vagina normal and uterus normal.  Cervical mucus is thin and watery and on fern test shows classic ferning which indicates anovulation  Musculoskeletal: Normal range of motion.  Neurological: She is alert and oriented to person, place, and time. She has normal reflexes.  Psychiatric: She has a normal mood and affect. Her behavior is normal. Judgment and thought content normal.    Data Reviewed Fern test positive for anovulation  Assessment    1 anovulation 2 amenorrhea due to anovulation. Normal functional ovarian cysts Plan: Provera x10 days 10 mg to withdraw and cause menses  Plan    As in assessment.        Kirubel Aja V 07/01/2013, 3:00 PM

## 2013-07-01 NOTE — Patient Instructions (Signed)
Take provera x 10 day, expect a period 2 days later.

## 2013-09-24 DIAGNOSIS — O24419 Gestational diabetes mellitus in pregnancy, unspecified control: Secondary | ICD-10-CM | POA: Insufficient documentation

## 2014-05-10 IMAGING — CT CT ABD-PELV W/ CM
2 of 4 series · 16 of 46 positions shown, 18 images · IV contrast (omnipaque)
Comparison: None.

CLINICAL DATA: Right side abdominal pain

EXAM:
CT ABDOMEN AND PELVIS WITH CONTRAST
TECHNIQUE: Multidetector CT imaging of the abdomen and pelvis was performed
using the standard protocol following bolus administration of
intravenous contrast.
CONTRAST:  50mL OMNIPAQUE IOHEXOL 300 MG/ML SOLN, 100mL OMNIPAQUE
IOHEXOL 300 MG/ML SOLN

[Series 2: abd_pel_with 5.0 b40f · axial · 0.72mm/px · z∈[-480,-50]mm · 13 of 96 slices shown, 15 images]
[im 5/96  soft-tissue]
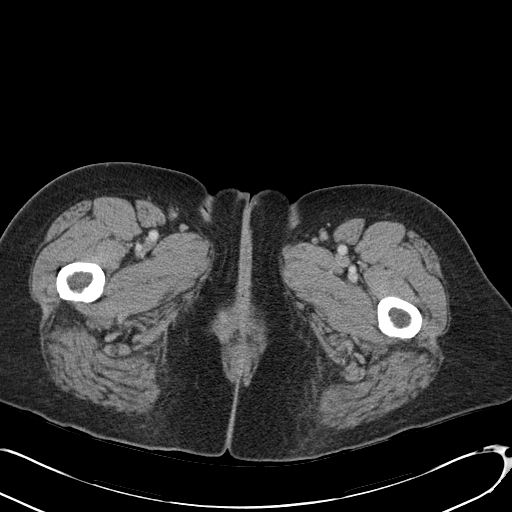
[im 5/96  bone]
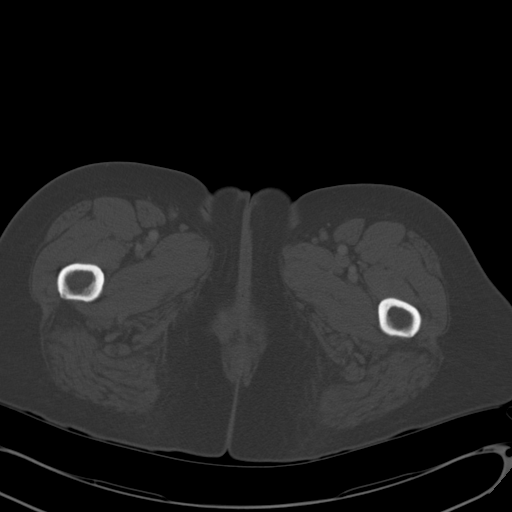
[im 14/96  soft-tissue]
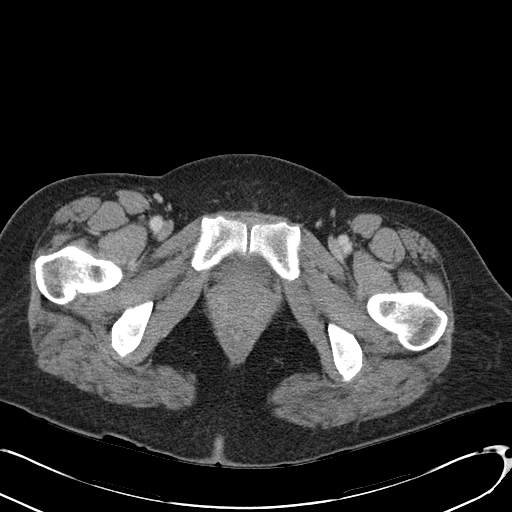
[im 19/96  soft-tissue]
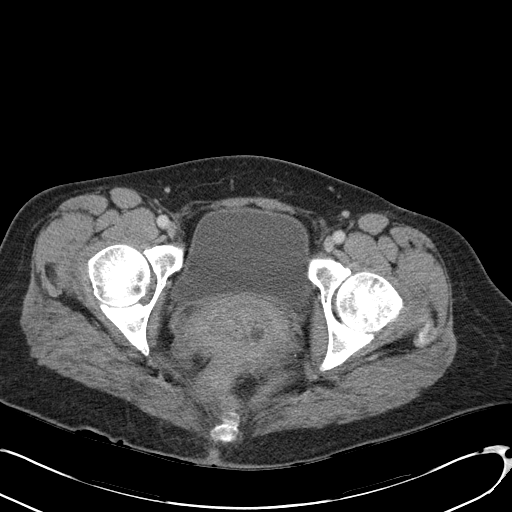
[im 28/96  soft-tissue]
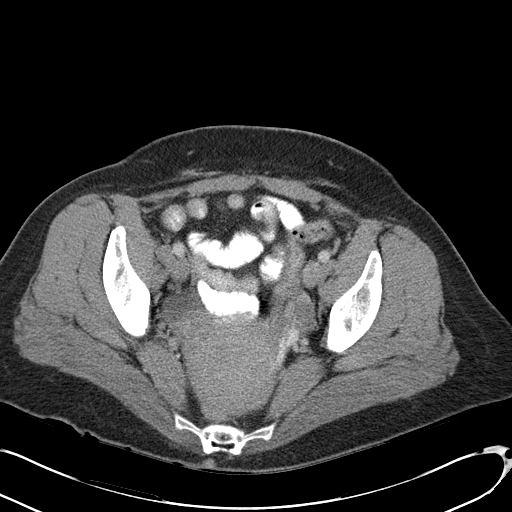
[im 32/96  soft-tissue]
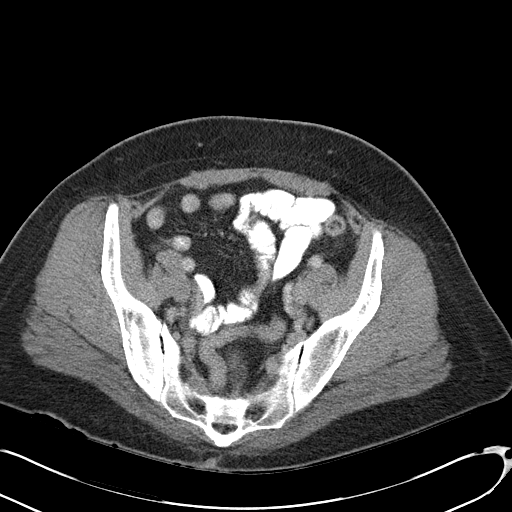
[im 41/96  soft-tissue]
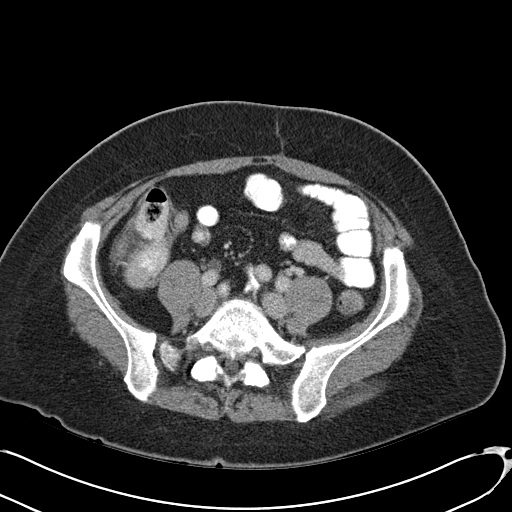
[im 50/96  soft-tissue]
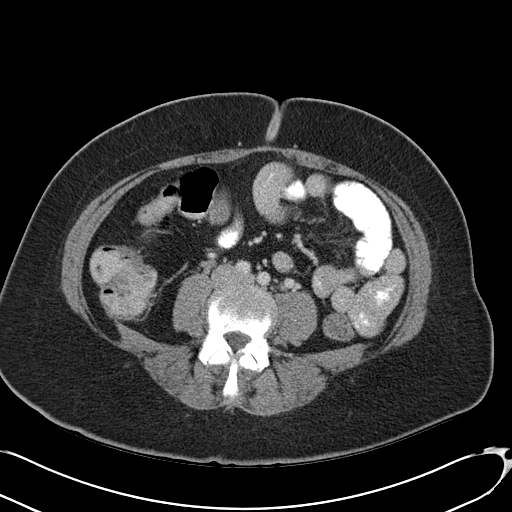
[im 55/96  soft-tissue]
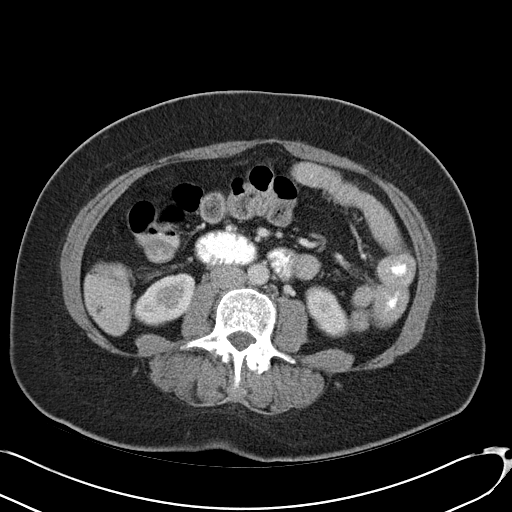
[im 64/96  soft-tissue]
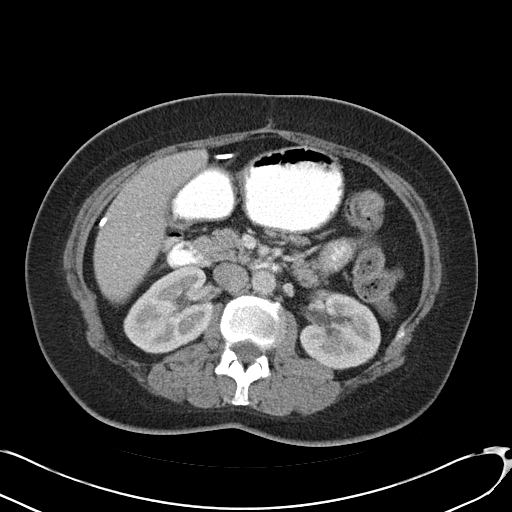
[im 64/96  bone]
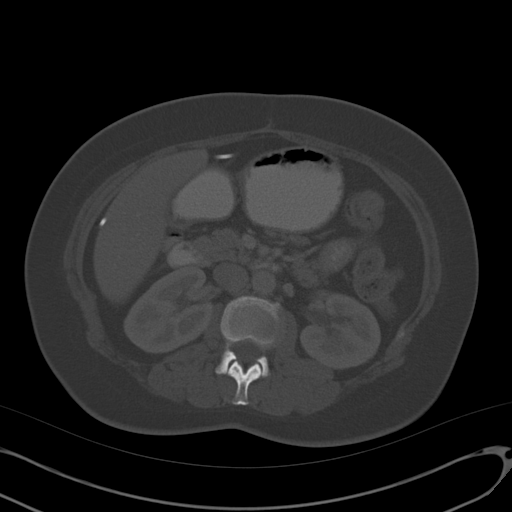
[im 68/96  soft-tissue]
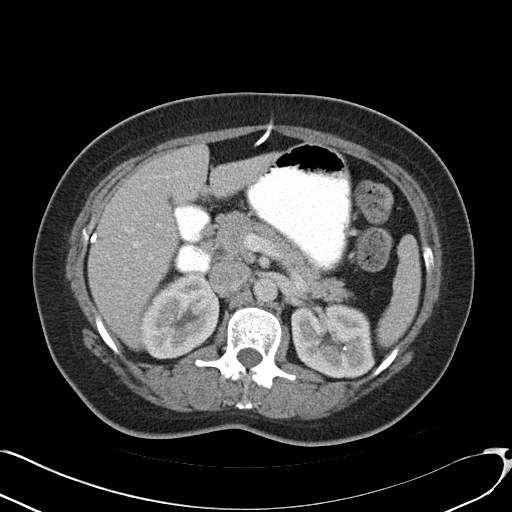
[im 77/96  soft-tissue]
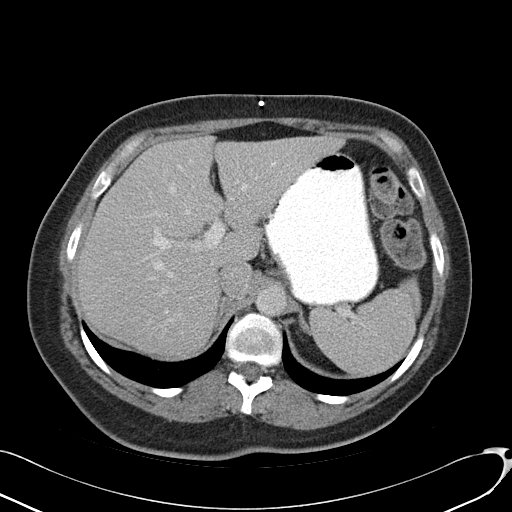
[im 82/96  soft-tissue]
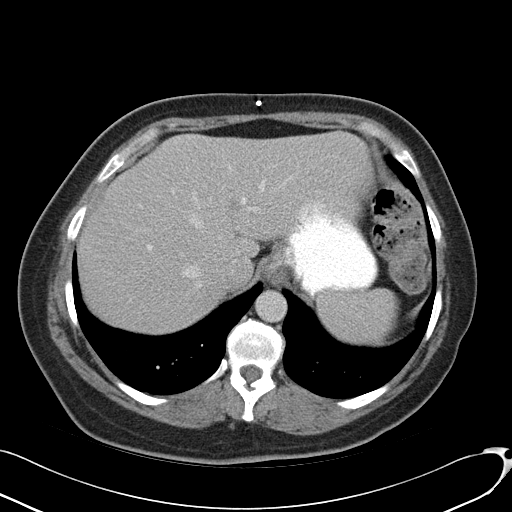
[im 91/96  soft-tissue]
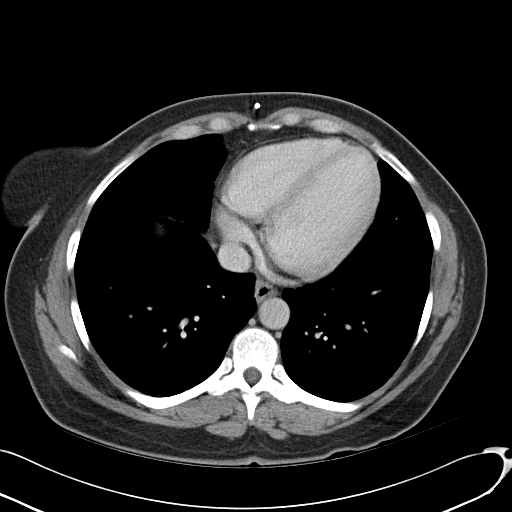

[Series 4: abd_pel_with 3.0 spo cor · coronal · 0.73mm/px · 3 of 92 slices shown]
[im 31/92  soft-tissue]
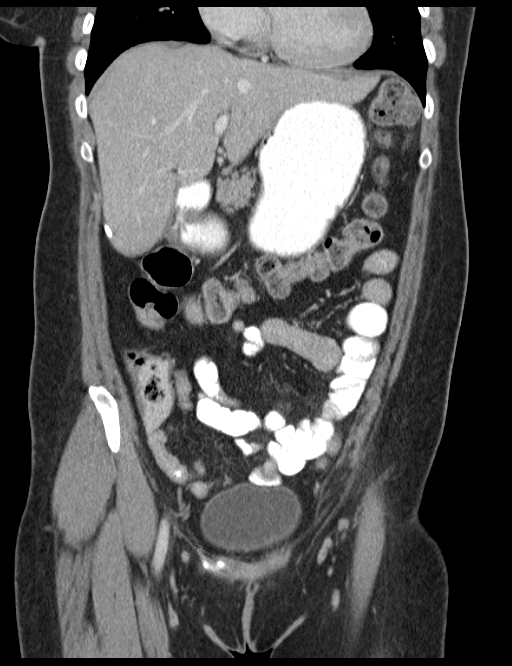
[im 41/92  soft-tissue]
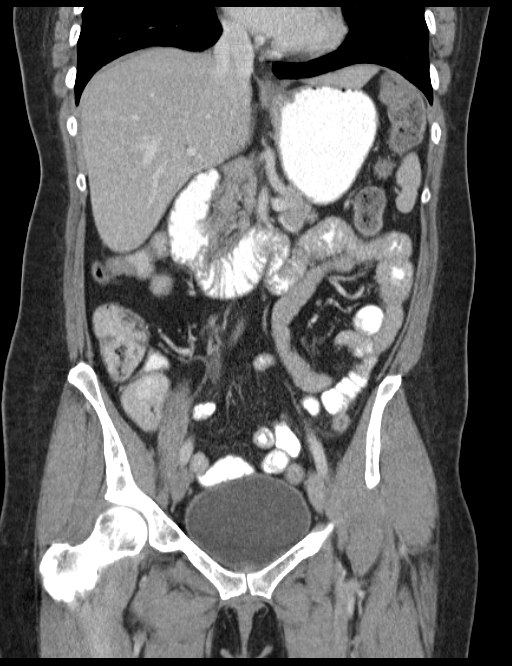
[im 51/92  soft-tissue]
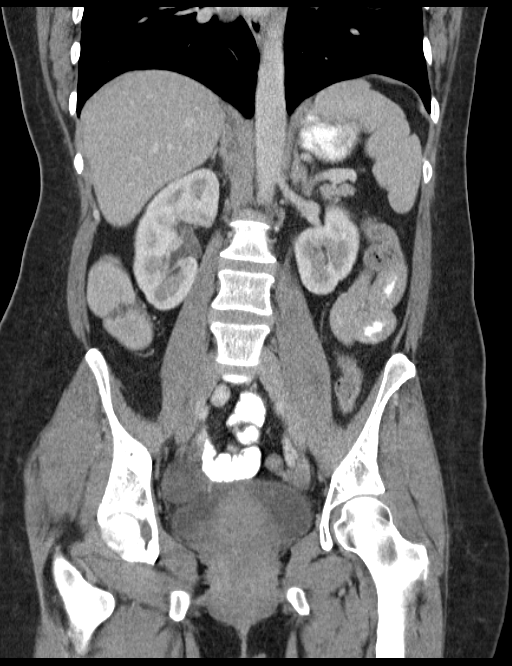

[16 of 46 positions shown; findings below may reference images not displayed]

FINDINGS: Lung bases are unremarkable. Sagittal images of the spine shows no
destructive bony lesions. A right VP shunt catheter is noted. Liver,
pancreas, spleen and adrenals are unremarkable. Kidneys are
symmetrical in size and enhancement. No hydronephrosis or
hydroureter. At least 2 punctate nonobstructive calcified calculi
are noted within right kidney the largest in midpole measures
mm. At least 3 nonobstructive calcified calculi are noted within
left kidney the largest in midpole measures 3 mm.

No calcified ureteral calculi are noted bilaterally.

No small bowel obstruction.  No ascites or free air.  No adenopathy.

Nonspecific minimal thickening of the wall of jejunal loops.
Nonspecific mild enteritis cannot be excluded.

There is a retroflexed uterus. No pericecal inflammation. The
terminal ileum is unremarkable.

The appendix is not identified. Minimal congested left adnexal
vessels. The urinary bladder is unremarkable.
IMPRESSION: 1. Bilateral nonobstructive nephrolithiasis. No hydronephrosis or
hydroureter.
2. No pericecal inflammation.  No small bowel obstruction.
3. Nonspecific minimal thickening of the wall of the jejunal loops.
Nonspecific mild enteritis cannot be excluded.
4. Retroflexed uterus.  Minimal congested left adnexal vessels.
5. Status postcholecystectomy.

## 2014-06-22 ENCOUNTER — Ambulatory Visit (INDEPENDENT_AMBULATORY_CARE_PROVIDER_SITE_OTHER): Payer: PRIVATE HEALTH INSURANCE | Admitting: Obstetrics & Gynecology

## 2014-06-22 VITALS — BP 120/70 | Wt 185.0 lb

## 2014-06-22 DIAGNOSIS — N8320 Unspecified ovarian cysts: Secondary | ICD-10-CM

## 2014-06-22 DIAGNOSIS — N921 Excessive and frequent menstruation with irregular cycle: Secondary | ICD-10-CM

## 2014-06-22 DIAGNOSIS — N83202 Unspecified ovarian cyst, left side: Secondary | ICD-10-CM

## 2014-06-22 MED ORDER — MEGESTROL ACETATE 40 MG PO TABS: ORAL_TABLET | ORAL | Status: DC

## 2014-06-22 NOTE — Progress Notes (Signed)
Patient ID: Brittney Reyes, female   DOB: 05/27/1971, 44 y.o.   MRN: [id number] Chief Complaint  Patient presents with  . gyn visit    seem in er/ abdomial pain. ct scan/ having vaginal/ rectum/nose bleeding.    HPI ER records and CT scan report reviewed  [facility] for back pain, spasm and left lower quadrant pain CT revealed a 4.8 cm simple cyst Also having a sceond period early, had similar problem 2 years ago  ROS As per HPI  No burning with urination, frequency or urgency No nausea, vomiting or diarrhea Nor fever chills or other constitutional symptoms   Blood pressure 120/70, weight 185 lb (83.915 kg), last menstrual period 06/17/2014.  EXAM No exam   Assessment/Plan:  Left ovarian cyst Menorrhagia  Megestrol algorithm, sonogram 6 weeks

## 2014-08-03 ENCOUNTER — Other Ambulatory Visit: Payer: PRIVATE HEALTH INSURANCE

## 2014-08-04 ENCOUNTER — Encounter: Payer: Self-pay | Admitting: Obstetrics & Gynecology

## 2014-08-04 ENCOUNTER — Ambulatory Visit (INDEPENDENT_AMBULATORY_CARE_PROVIDER_SITE_OTHER): Payer: PRIVATE HEALTH INSURANCE

## 2014-08-04 ENCOUNTER — Other Ambulatory Visit: Payer: Self-pay | Admitting: Obstetrics & Gynecology

## 2014-08-04 ENCOUNTER — Ambulatory Visit (INDEPENDENT_AMBULATORY_CARE_PROVIDER_SITE_OTHER): Payer: PRIVATE HEALTH INSURANCE | Admitting: Obstetrics & Gynecology

## 2014-08-04 VITALS — BP 132/80 | Ht 66.0 in | Wt 178.4 lb

## 2014-08-04 DIAGNOSIS — N83202 Unspecified ovarian cyst, left side: Secondary | ICD-10-CM

## 2014-08-04 DIAGNOSIS — N921 Excessive and frequent menstruation with irregular cycle: Secondary | ICD-10-CM

## 2014-08-04 DIAGNOSIS — N8320 Unspecified ovarian cysts: Secondary | ICD-10-CM

## 2014-08-04 DIAGNOSIS — N83201 Unspecified ovarian cyst, right side: Secondary | ICD-10-CM

## 2014-08-04 DIAGNOSIS — N920 Excessive and frequent menstruation with regular cycle: Secondary | ICD-10-CM

## 2014-08-04 DIAGNOSIS — N200 Calculus of kidney: Secondary | ICD-10-CM

## 2014-08-04 MED ORDER — MEGESTROL ACETATE 40 MG PO TABS: ORAL_TABLET | ORAL | Status: DC

## 2014-08-04 NOTE — Progress Notes (Signed)
Patient ID: Brittney Reyes, female   DOB: 11/12/1970, 44 y.o.   MRN: [id number] Remember:  The highest supported level for 2 out of 3 for history, physical and MDM determines the overall CPT code.  Chief Complaint  Patient presents with  . Follow-up    ultrasound today.     HPI:   Pt seen in follow up of an acute problem. Had 4.8 cm left ovarian cyst Placed on megestrol.  No further LLQ pain.  Also had a kidney stone she passed last week Pt also has long history of menorrhagia and dysmenorrhea, amenorrheic on megestrol  Location:  .Right flank Quality:  .colicky Severity:  Moderate 5/10 Timing:  Intermittent now resolved. Duration:  . Context:  History of kidney stones. Modifying factors:   Signs/Symptoms:    Problem Pertinent ROS:      Resolved LLQ pain  No hematuria now No dyspareunia No dysuria  Extended ROS:       PMFSH:          Blood pressure 132/80, height 5\' 6"  (1.676 m), weight 178 lb 6.4 oz (80.922 kg), last menstrual period 06/17/2014.  HT 765ft 6in  Physical Examination:    General  WDWN female in NAD Back tenderness over right CVAT, negative over left Abdomen soft non distended minimal tenderness right LQ no LLQ pain no guarding no rebound  [place] Transvaginal Non-ob  08/04/2014   GYNECOLOGIC SONOGRAM   Brittney HardingMichelle L Johnston is a 44 y.o. LMP 06/17/2014 for a pelvic sonogram for  menometrorrhagia and Rt ovarian cyst. Pt is currently taking Megace.  Uterus                      9.2 x 6.6 x 5.3 cm, retroverted  Endometrium          8.3 mm, no obvious solitary mass noted  Right ovary             2.4 x 1.5 x 1.2 cm,   Left ovary                2.6 x 1.3 x 1.2 cm,   No free fluid or adnexal masses noted within the pelvis  Technician Comments:  Retroverted uterus, Endometrium-8.1073mm, bilateral adnexa appears WNL, no  free fluid or adnexal masses noted within the pelvis   Chari ManningMcBride, Tasha 08/04/2014 9:28 AM  Clinical Impression and recommendations:  I have reviewed the sonogram results  above, combined with the patient's  current clinical course, below are my impressions and any appropriate  recommendations for management based on the sonographic findings.  Resolution of right ovarian cyst Otherwise normal pelvic anatomy   EURE,LUTHER H 08/04/2014 9:57 AM      The sonogram images themselves were reviewed and a report is done  A: Left ovarian cyst: resolved Kidney stones: passed one last week, stable Menorrhagia:  Responded to the megestrol with amenorrhea, she will decide whether to                        continue for that reason P: Needs yearly, follow up 1 month Will decide on the megestrol therapy

## 2014-09-03 ENCOUNTER — Encounter: Payer: Self-pay | Admitting: *Deleted

## 2014-09-03 ENCOUNTER — Ambulatory Visit: Payer: PRIVATE HEALTH INSURANCE | Admitting: Obstetrics & Gynecology

## 2015-03-29 DIAGNOSIS — Z982 Presence of cerebrospinal fluid drainage device: Secondary | ICD-10-CM | POA: Insufficient documentation

## 2016-02-10 DIAGNOSIS — G4733 Obstructive sleep apnea (adult) (pediatric): Secondary | ICD-10-CM | POA: Insufficient documentation

## 2017-04-27 DIAGNOSIS — Z79899 Other long term (current) drug therapy: Secondary | ICD-10-CM | POA: Insufficient documentation

## 2017-06-06 DIAGNOSIS — T23201A Burn of second degree of right hand, unspecified site, initial encounter: Secondary | ICD-10-CM | POA: Insufficient documentation

## 2017-11-22 DIAGNOSIS — F419 Anxiety disorder, unspecified: Secondary | ICD-10-CM | POA: Insufficient documentation

## 2017-12-06 DIAGNOSIS — Z794 Long term (current) use of insulin: Secondary | ICD-10-CM | POA: Insufficient documentation

## 2020-08-19 DIAGNOSIS — R5383 Other fatigue: Secondary | ICD-10-CM | POA: Insufficient documentation

## 2020-08-19 DIAGNOSIS — R002 Palpitations: Secondary | ICD-10-CM | POA: Insufficient documentation

## 2020-09-07 LAB — HM PAP SMEAR

## 2021-03-03 LAB — HM HIV SCREENING LAB: HM HIV Screening: NEGATIVE

## 2021-03-03 LAB — HM HEPATITIS C SCREENING LAB: HM Hepatitis Screen: NEGATIVE

## 2021-12-05 ENCOUNTER — Encounter: Payer: Self-pay | Admitting: Family Medicine

## 2021-12-05 ENCOUNTER — Ambulatory Visit (INDEPENDENT_AMBULATORY_CARE_PROVIDER_SITE_OTHER): Payer: 59 | Admitting: Family Medicine

## 2021-12-05 ENCOUNTER — Encounter: Payer: Self-pay | Admitting: *Deleted

## 2021-12-05 VITALS — BP 131/78 | HR 71 | Ht 66.5 in | Wt 162.6 lb

## 2021-12-05 DIAGNOSIS — Z720 Tobacco use: Secondary | ICD-10-CM

## 2021-12-05 DIAGNOSIS — Z114 Encounter for screening for human immunodeficiency virus [HIV]: Secondary | ICD-10-CM

## 2021-12-05 DIAGNOSIS — I10 Essential (primary) hypertension: Secondary | ICD-10-CM

## 2021-12-05 DIAGNOSIS — R69 Illness, unspecified: Secondary | ICD-10-CM | POA: Diagnosis not present

## 2021-12-05 DIAGNOSIS — E559 Vitamin D deficiency, unspecified: Secondary | ICD-10-CM | POA: Diagnosis not present

## 2021-12-05 DIAGNOSIS — Z1211 Encounter for screening for malignant neoplasm of colon: Secondary | ICD-10-CM

## 2021-12-05 DIAGNOSIS — F320 Major depressive disorder, single episode, mild: Secondary | ICD-10-CM

## 2021-12-05 DIAGNOSIS — R7301 Impaired fasting glucose: Secondary | ICD-10-CM

## 2021-12-05 DIAGNOSIS — Z0001 Encounter for general adult medical examination with abnormal findings: Secondary | ICD-10-CM

## 2021-12-05 NOTE — Assessment & Plan Note (Signed)
Smokes about 1/2-1 pack/day  Asked about quitting: confirms that she currently smokes cigarettes Advise to quit smoking: Educated about QUITTING to reduce the risk of cancer, cardio and cerebrovascular disease. Assess willingness: Unwilling to quit at this time, but is working on cutting back. Assist with counseling and pharmacotherapy: Counseled for 5 minutes and literature provided. Arrange for follow up: follow up in 3 months and continue to offer help.

## 2021-12-05 NOTE — Assessment & Plan Note (Signed)
resolved 

## 2021-12-08 DIAGNOSIS — Z114 Encounter for screening for human immunodeficiency virus [HIV]: Secondary | ICD-10-CM | POA: Diagnosis not present

## 2021-12-08 DIAGNOSIS — R69 Illness, unspecified: Secondary | ICD-10-CM | POA: Diagnosis not present

## 2021-12-08 DIAGNOSIS — Z0001 Encounter for general adult medical examination with abnormal findings: Secondary | ICD-10-CM | POA: Diagnosis not present

## 2021-12-08 DIAGNOSIS — R7301 Impaired fasting glucose: Secondary | ICD-10-CM | POA: Diagnosis not present

## 2021-12-08 DIAGNOSIS — E559 Vitamin D deficiency, unspecified: Secondary | ICD-10-CM | POA: Diagnosis not present

## 2021-12-09 ENCOUNTER — Telehealth: Payer: Self-pay | Admitting: Family Medicine

## 2021-12-09 ENCOUNTER — Other Ambulatory Visit: Payer: Self-pay | Admitting: Family Medicine

## 2021-12-09 DIAGNOSIS — E785 Hyperlipidemia, unspecified: Secondary | ICD-10-CM

## 2021-12-09 DIAGNOSIS — E119 Type 2 diabetes mellitus without complications: Secondary | ICD-10-CM

## 2021-12-09 LAB — TSH+FREE T4
Free T4: 1.02 ng/dL (ref 0.82–1.77)
TSH: 3.57 u[IU]/mL (ref 0.450–4.500)

## 2021-12-09 LAB — CBC
Hematocrit: 36.2 % (ref 34.0–46.6)
Hemoglobin: 11.7 g/dL (ref 11.1–15.9)
MCH: 27.5 pg (ref 26.6–33.0)
MCHC: 32.3 g/dL (ref 31.5–35.7)
MCV: 85 fL (ref 79–97)
Platelets: 396 10*3/uL (ref 150–450)
RBC: 4.25 x10E6/uL (ref 3.77–5.28)
RDW: 14.2 % (ref 11.7–15.4)
WBC: 8 10*3/uL (ref 3.4–10.8)

## 2021-12-09 LAB — CMP14+EGFR
ALT: 8 IU/L (ref 0–32)
AST: 10 IU/L (ref 0–40)
Albumin/Globulin Ratio: 1.8 (ref 1.2–2.2)
Albumin: 4.4 g/dL (ref 3.8–4.8)
Alkaline Phosphatase: 67 IU/L (ref 44–121)
BUN/Creatinine Ratio: 16 (ref 9–23)
BUN: 12 mg/dL (ref 6–24)
Bilirubin Total: <0.2 mg/dL (ref 0.0–1.2)
CO2: 23 mmol/L (ref 20–29)
Calcium: 9.5 mg/dL (ref 8.7–10.2)
Chloride: 98 mmol/L (ref 96–106)
Creatinine, Ser: 0.73 mg/dL (ref 0.57–1.00)
Globulin, Total: 2.4 g/dL (ref 1.5–4.5)
Glucose: 120 mg/dL — ABNORMAL HIGH (ref 70–99)
Potassium: 3.3 mmol/L — ABNORMAL LOW (ref 3.5–5.2)
Sodium: 138 mmol/L (ref 134–144)
Total Protein: 6.8 g/dL (ref 6.0–8.5)
eGFR: 101 mL/min/{1.73_m2} (ref >59)

## 2021-12-09 LAB — LIPID PANEL
Chol/HDL Ratio: 5.4 ratio — ABNORMAL HIGH (ref 0.0–4.4)
Cholesterol, Total: 209 mg/dL — ABNORMAL HIGH (ref 100–199)
HDL: 39 mg/dL — ABNORMAL LOW (ref >39)
LDL Chol Calc (NIH): 132 mg/dL — ABNORMAL HIGH (ref 0–99)
Triglycerides: 212 mg/dL — ABNORMAL HIGH (ref 0–149)
VLDL Cholesterol Cal: 38 mg/dL (ref 5–40)

## 2021-12-09 LAB — HEMOGLOBIN A1C
Est. average glucose Bld gHb Est-mCnc: 148 mg/dL
Hgb A1c MFr Bld: 6.8 % — ABNORMAL HIGH (ref 4.8–5.6)

## 2021-12-09 LAB — VITAMIN D 25 HYDROXY (VIT D DEFICIENCY, FRACTURES): Vit D, 25-Hydroxy: 25.5 ng/mL — ABNORMAL LOW (ref 30.0–100.0)

## 2021-12-09 LAB — HIV ANTIBODY (ROUTINE TESTING W REFLEX): HIV Screen 4th Generation wRfx: NONREACTIVE

## 2021-12-09 MED ORDER — ROSUVASTATIN CALCIUM 10 MG PO TABS: 10.0000 mg | ORAL_TABLET | Freq: Every day | ORAL | 3 refills | Status: DC

## 2021-12-09 MED ORDER — METFORMIN HCL 500 MG PO TABS: 500.0000 mg | ORAL_TABLET | Freq: Two times a day (BID) | ORAL | 3 refills | Status: DC

## 2021-12-09 NOTE — Progress Notes (Signed)
  Please inform the patient that she has diabetes and her cholesterol is elevated. I've started her on metformin and Rosuvastatin. Pt advises the pt not to take Rosuvastatin if she plans on getting pregnant or if she is pregnant. Her Vit D is slightly low. i recommend OTC Vit D 5000 iu daily.  Lifestyle change recommendations include weight loss, low carbs, fat, and calorie diet.

## 2021-12-09 NOTE — Progress Notes (Unsigned)
The 10-year ASCVD risk score (Arnett DK, et al., 2019) is: 14.5%   Values used to calculate the score:     Age: 51 years     Sex: Female     Is Non-Hispanic African American: No     Diabetic: Yes     Tobacco smoker: Yes     Systolic Blood Pressure: 131 mmHg     Is BP treated: Yes     HDL Cholesterol: 39 mg/dL     Total Cholesterol: 209 mg/dL

## 2021-12-09 NOTE — Telephone Encounter (Signed)
Returned call to go over labs, verbalized understanding.

## 2021-12-09 NOTE — Telephone Encounter (Signed)
Pt returning call for labs 

## 2021-12-23 ENCOUNTER — Telehealth: Payer: Self-pay | Admitting: Family Medicine

## 2021-12-23 NOTE — Telephone Encounter (Signed)
Pt unsure if its from metformin or rosuvastatin, only happens when she eats and then she has diarrhea right after, medications have made her back spasm worse, stopped the medications today, will wait on a call back, please advice?

## 2021-12-23 NOTE — Telephone Encounter (Signed)
Metformin can cause diarrhea. I recommend taking medication once daily. She can take OTC  Imodium A-D for diarrhea. If this doesn't help, please advise pt to call back.

## 2021-12-23 NOTE — Telephone Encounter (Signed)
Pt called stating ever since she started the new medication she has had diarrhea. States everything she eats go right through her. Wants to know what to do?  Ongoing since last week.

## 2021-12-23 NOTE — Telephone Encounter (Signed)
Called pt to let her know she will call on Monday if not any better.

## 2022-01-06 ENCOUNTER — Encounter: Payer: Self-pay | Admitting: Family Medicine

## 2022-01-06 NOTE — Progress Notes (Signed)
documentation

## 2022-01-23 ENCOUNTER — Telehealth: Payer: Self-pay | Admitting: *Deleted

## 2022-01-23 NOTE — Telephone Encounter (Signed)
Received colonoscopy questionnaire from pt. She marked she is having change in bowel habits, diarrhea. Will need OV. Thanks!

## 2022-01-25 NOTE — Telephone Encounter (Signed)
OV made °

## 2022-02-27 ENCOUNTER — Ambulatory Visit (INDEPENDENT_AMBULATORY_CARE_PROVIDER_SITE_OTHER): Payer: 59 | Admitting: Gastroenterology

## 2022-02-27 ENCOUNTER — Encounter: Payer: Self-pay | Admitting: Gastroenterology

## 2022-02-27 VITALS — BP 130/78 | HR 77 | Temp 97.4°F | Ht 66.0 in | Wt 157.2 lb

## 2022-02-27 DIAGNOSIS — R634 Abnormal weight loss: Secondary | ICD-10-CM

## 2022-02-27 DIAGNOSIS — R197 Diarrhea, unspecified: Secondary | ICD-10-CM | POA: Diagnosis not present

## 2022-02-27 DIAGNOSIS — Z1211 Encounter for screening for malignant neoplasm of colon: Secondary | ICD-10-CM

## 2022-02-27 NOTE — Progress Notes (Signed)
  GI Office Note    Referring Provider: Zarwolo, Gloria, FNP Primary Care Physician:  Zarwolo, Gloria, FNP  Primary Gastroenterologist: Dr. Carver  Chief Complaint   Chief Complaint  Patient presents with   Colonoscopy    Pt here for consult for colonoscopy   History of Present Illness   Brittney Reyes is a 51 y.o. female presenting today at the request of Zarwolo, Gloria, FNP for screening colonoscopy.  Dr. Pandia/Patel - seen them as a teenager and had a colonoscopy.   Had hydrocephalus and had a VP shunt in place. Remembers being old that she had some polyps removed. Dr. George in [place], [place] - general surgeon (removed gallbladder). Had a GI study as well.   Today: Diarrhea - Has been occurring since she began taking metformin. No issues prior to this. Last night ate chicken, beans, and cornbread and having diarrhea. Has had heartburn and gas as well - does not typically have issues with that. Also had her gallbladder removed about 5-10 years ago. Has not had any looser stools or any issues after her gallbladder removed. Typically loves mexican food and has no trouble eating what she wants. Has been on the metformin for about 1 month. Has lost 10 pounds since then as well. Has appointment to discuss her metformin and diarrhea a that time.   Always has hemorrhoids. No longer having constipation (had IBS-C). Used to take metamucil everyday and sometimes would take a laxative.   Weight loss - over the last 2 years she lost from size 16-9. Lost a little over 30 pounds without trying. 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   Does have some occasional palpitations - has seen cardiology before and last seen them a few years prior. Doe snot think new insurance will pay for her to see them.   Denies chest pain, shortness of breath, edema, melena, BRBPR, dysphagia, lack of appetite. Does have some occasional early satiety.  Denies nausea/vomiting. Has history of potassium deficiency.    Current Outpatient Medications  Medication Sig Dispense Refill   acetaminophen (TYLENOL) 500 MG tablet Take 1,000 mg by mouth daily as needed for headache.     cetirizine (ZYRTEC) 10 MG tablet Take 10 mg by mouth daily.     metFORMIN (GLUCOPHAGE) 500 MG tablet Take 1 tablet (500 mg total) by mouth 2 (two) times daily with a meal. 180 tablet 3   metoprolol succinate (TOPROL-XL) 25 MG 24 hr tablet Take 1 tablet by mouth daily.     rosuvastatin (CRESTOR) 10 MG tablet Take 1 tablet (10 mg total) by mouth daily. 90 tablet 3   triamterene-hydrochlorothiazide (MAXZIDE-25) 37.5-25 MG per tablet Take 1 tablet by mouth daily.     amLODipine (NORVASC) 5 MG tablet Take 5 mg by mouth daily.     carvedilol (COREG) 25 MG tablet Take 25 mg by mouth 2 (two) times daily with a meal.     Cholecalciferol (HM VITAMIN D3) 2000 UNITS CAPS Take 1 capsule by mouth daily.     dicyclomine (BENTYL) 20 MG tablet Take one every 6-8h as needed for abd cramps (Patient not taking: Reported on 06/22/2014) 30 tablet 0   hydrocortisone-pramoxine (PROCTOFOAM HC) rectal foam Place 1 applicator rectally 2 (two) times daily. 10 g 11   ibuprofen (ADVIL,MOTRIN) 200 MG tablet Take 800 mg by mouth daily as needed for headache.     medroxyPROGESTERone (PROVERA) 10   MG tablet Take 1 tablet (10 mg total) by mouth daily. (Patient not taking: Reported on 08/04/2014) 10 tablet 1   megestrol (MEGACE) 40 MG tablet Take 1 daily 30 tablet 11   sertraline (ZOLOFT) 100 MG tablet Take 50 mg by mouth daily.      No current facility-administered medications for this visit.    Past Medical History:  Diagnosis Date   Anxiety    Hydrocephalus ([place])    Hypertension     Past Surgical History:  Procedure Laterality Date   CESAREAN SECTION     CHOLECYSTECTOMY     LEEP SURGERY     SHUT IN STOMACH     Shunt in stomach (VP shunt)   TONSILLECTOMY      Family History  Problem Relation  Age of Onset   Diabetes Mother    Hypertension Mother    Hypertension Father    Migraines Daughter    Asthma Child     Allergies as of 02/27/2022 - Review Complete 02/27/2022  Allergen Reaction Noted   Duloxetine Hives 09/24/2013   Fluoxetine Nausea And Vomiting 09/29/2015   Milk (cow) Hives 09/24/2013   Gabapentin Other (See Comments) 07/06/2016   Prozac [fluoxetine hcl] Nausea And Vomiting and Swelling 05/15/2013   Wellbutrin [bupropion] Hives 05/15/2013    Social History   Socioeconomic History   Marital status: Married    Spouse name: Not on file   Number of children: Not on file   Years of education: Not on file   Highest education level: Not on file  Occupational History   Not on file  Tobacco Use   Smoking status: Every Day    Packs/day: 0.50    Years: 25.00    Total pack years: 12.50    Types: Cigarettes   Smokeless tobacco: Never  Substance and Sexual Activity   Alcohol use: No   Drug use: No   Sexual activity: Not on file  Other Topics Concern   Not on file  Social History Narrative   Not on file   Social Determinants of Health   Financial Resource Strain: Not on file  Food Insecurity: Not on file  Transportation Needs: Not on file  Physical Activity: Not on file  Stress: Not on file  Social Connections: Not on file  Intimate Partner Violence: Not on file     Review of Systems   Gen: Denies any fever, chills, fatigue, weight loss, lack of appetite.  CV: Denies chest pain, heart palpitations, peripheral edema, syncope.  Resp: Denies shortness of breath at rest or with exertion. Denies wheezing or cough.  GI: see HPI GU : Denies urinary burning, urinary frequency, urinary hesitancy MS: Denies joint pain, muscle weakness, cramps, or limitation of movement.  Derm: Denies rash, itching, dry skin Psych: Denies depression, anxiety, memory loss, and confusion Heme: Denies bruising, bleeding, and enlarged lymph nodes.   Physical Exam   BP 130/78    Pulse 77   Temp (!) 97.4 F (36.3 C)   Ht 5\' 6"  (1.676 m)   Wt 157 lb 3.2 oz (71.3 kg)   LMP 01/27/2022   BMI 25.37 kg/m   General:   Alert and oriented. Pleasant and cooperative. Well-nourished and well-developed.  Head:  Normocephalic and atraumatic. Eyes:  Without icterus, sclera clear and conjunctiva pink.  Ears:  Normal auditory acuity. Mouth:  No deformity or lesions, oral mucosa pink.  Lungs:  Clear to auscultation bilaterally. No wheezes, rales, or rhonchi. No distress.  Heart:  S1,  S2 present without murmurs appreciated.  Abdomen:  +BS, soft, non-distended. Mild ttp to epigastrium. No HSM noted. No guarding or rebound. No masses appreciated.  Rectal:  Deferred  Msk:  Symmetrical without gross deformities. Normal posture. Extremities:  Without edema. Neurologic:  Alert and  oriented x4;  grossly normal neurologically. Skin:  Intact without significant lesions or rashes. Psych:  Alert and cooperative. Normal mood and affect.   Assessment   Brittney Reyes is a 51 y.o. female with a history of anxiety, hydrocephalus, HTN presenting today for evaluation prior to scheduling screening colonoscopy and diarrhea.   Screening for colon cancer: Denies any family history of colon cancer or colon polyps.  She reports she had a colonoscopy in the past and thinks she may have been told she had some polyps but she is unsure.  She states she is unsure if Dr. Allena Katz or Dr. Sarajane Marek in [place] performed at or if it was performed by Dr. Greggory Stallion in [place], [place] after she had her gallbladder removed.  We will request records from both of these places to discern whether or not she has actually had a colonoscopy.  Weight loss and diarrhea as stated below.  Otherwise she denies any melena, hematochezia, abdominal pain, nausea/vomiting, lack of appetite, early satiety, reflux, dysphagia, chest pain, shortness of breath, edema.  Regardless, she is at the recommended age for screening colonoscopy,  therefore we will proceed with colonoscopy with Dr. Marletta Lor in the near future.  Diarrhea: Likely secondary to metformin. Symptoms began just after first dose and have continued.  Typically suffer from constipation and used to take daily fiber and occasional laxatives. Started on this medication about 1 month prior.  Also has had some associated gas pains.  History of gallbladder removal about 5-10 years ago but has denied any looser stools after this.  Typically has been able to eat what ever she wants without any issue.  She has follow-up with her PCP in about a month to discuss her diarrhea and continuing metformin or trying a different medication.  She did have some tenderness to palpation to her epigastrium on exam but notes that this feels like gas pains.  Weight loss: Reports about a 30 pound weight loss over the last 2 years without trying.  Reports that 10 of those last pounds has been in the last month since starting metformin.  Current weight is about 157 pounds.  Previously was 162 pounds at her PCP visit on 12/05/2021.  Last weight at prior PCP office in September 2021 was 171 pounds.  In May 2021 she was 175 pounds, but prior to this in March 2021 she was also 171 pounds then.  She would like to stay around the 155-160lb range.  She reports her eating habits have not changed.  She typically only eats 1 meal per day and has been doing that for a long time.  Most recent lab work performed with normal CBC, TSH.  CMP with low potassium 3.3, and elevated glucose.  Advised to continue to monitor weight and will address at follow-up post procedures.  Likely this most recent weight loss related to hydration/diarrhea and decreased blood sugar as a result of metformin.  PLAN   Proceed with colonoscopy with propofol by Dr. Marletta Lor in near future: the risks, benefits, and alternatives have been discussed with the patient in detail. The patient states understanding and desires to proceed. ASA 2 Hold metformin  night prior to and morning of.  Call with medication changes.  Records request for colonoscopy from Dr. Greggory Stallion in [place], [place] and Dr. Juanita Craver in [place], [place].  Follow up 2 months post procedure regarding weight loss.    Brooke Bonito, MSN, FNP-BC, AGACNP-BC [facility]

## 2022-02-27 NOTE — H&P (View-Only) (Signed)
GI Office Note    Referring Provider: Gilmore LarocheZarwolo, Gloria, FNP Primary Care Physician:  Gilmore LarocheZarwolo, Gloria, FNP  Primary Gastroenterologist: Dr. Marletta Lorarver  Chief Complaint   Chief Complaint  Patient presents with   Colonoscopy    Pt here for consult for colonoscopy   History of Present Illness   Brittney Reyes is a 51 y.o. female presenting today at the request of Gilmore LarocheZarwolo, Gloria, FNP for screening colonoscopy.  Dr. Juanita CraverPandia/Patel - seen them as a teenager and had a colonoscopy.   Had hydrocephalus and had a VP shunt in place. Remembers being old that she had some polyps removed. Dr. Greggory StallionGeorge in [place], [place] - general surgeon (removed gallbladder). Had a GI study as well.   Today: Diarrhea - Has been occurring since she began taking metformin. No issues prior to this. Last night ate chicken, beans, and cornbread and having diarrhea. Has had heartburn and gas as well - does not typically have issues with that. Also had her gallbladder removed about 5-10 years ago. Has not had any looser stools or any issues after her gallbladder removed. Typically loves [place] food and has no trouble eating what she wants. Has been on the metformin for about 1 month. Has lost 10 pounds since then as well. Has appointment to discuss her metformin and diarrhea a that time.   Always has hemorrhoids. No longer having constipation (had IBS-C). Used to take metamucil everyday and sometimes would take a laxative.   Weight loss - over the last 2 years she lost from size 16-9. Lost a little over 30 pounds without trying. 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   Does have some occasional palpitations - has seen cardiology before and last seen them a few years prior. Doe snot think new insurance will pay for her to see them.   Denies chest pain, shortness of breath, edema, melena, BRBPR, dysphagia, lack of appetite. Does have some occasional early satiety.  Denies nausea/vomiting. Has history of potassium deficiency.    Current Outpatient Medications  Medication Sig Dispense Refill   acetaminophen (TYLENOL) 500 MG tablet Take 1,000 mg by mouth daily as needed for headache.     cetirizine (ZYRTEC) 10 MG tablet Take 10 mg by mouth daily.     metFORMIN (GLUCOPHAGE) 500 MG tablet Take 1 tablet (500 mg total) by mouth 2 (two) times daily with a meal. 180 tablet 3   metoprolol succinate (TOPROL-XL) 25 MG 24 hr tablet Take 1 tablet by mouth daily.     rosuvastatin (CRESTOR) 10 MG tablet Take 1 tablet (10 mg total) by mouth daily. 90 tablet 3   triamterene-hydrochlorothiazide (MAXZIDE-25) 37.5-25 MG per tablet Take 1 tablet by mouth daily.     amLODipine (NORVASC) 5 MG tablet Take 5 mg by mouth daily.     carvedilol (COREG) 25 MG tablet Take 25 mg by mouth 2 (two) times daily with a meal.     Cholecalciferol (HM VITAMIN D3) 2000 UNITS CAPS Take 1 capsule by mouth daily.     dicyclomine (BENTYL) 20 MG tablet Take one every 6-8h as needed for abd cramps (Patient not taking: Reported on 06/22/2014) 30 tablet 0   hydrocortisone-pramoxine (PROCTOFOAM HC) rectal foam Place 1 applicator rectally 2 (two) times daily. 10 g 11   ibuprofen (ADVIL,MOTRIN) 200 MG tablet Take 800 mg by mouth daily as needed for headache.     medroxyPROGESTERone (PROVERA) 10  MG tablet Take 1 tablet (10 mg total) by mouth daily. (Patient not taking: Reported on 08/04/2014) 10 tablet 1   megestrol (MEGACE) 40 MG tablet Take 1 daily 30 tablet 11   sertraline (ZOLOFT) 100 MG tablet Take 50 mg by mouth daily.      No current facility-administered medications for this visit.    Past Medical History:  Diagnosis Date   Anxiety    Hydrocephalus ([place])    Hypertension     Past Surgical History:  Procedure Laterality Date   CESAREAN SECTION     CHOLECYSTECTOMY     LEEP SURGERY     SHUT IN STOMACH     Shunt in stomach (VP shunt)   TONSILLECTOMY      Family History  Problem Relation  Age of Onset   Diabetes Mother    Hypertension Mother    Hypertension Father    Migraines Daughter    Asthma Child     Allergies as of 02/27/2022 - Review Complete 02/27/2022  Allergen Reaction Noted   Duloxetine Hives 09/24/2013   Fluoxetine Nausea And Vomiting 09/29/2015   Milk (cow) Hives 09/24/2013   Gabapentin Other (See Comments) 07/06/2016   Prozac [fluoxetine hcl] Nausea And Vomiting and Swelling 05/15/2013   Wellbutrin [bupropion] Hives 05/15/2013    Social History   Socioeconomic History   Marital status: Married    Spouse name: Not on file   Number of children: Not on file   Years of education: Not on file   Highest education level: Not on file  Occupational History   Not on file  Tobacco Use   Smoking status: Every Day    Packs/day: 0.50    Years: 25.00    Total pack years: 12.50    Types: Cigarettes   Smokeless tobacco: Never  Substance and Sexual Activity   Alcohol use: No   Drug use: No   Sexual activity: Not on file  Other Topics Concern   Not on file  Social History Narrative   Not on file   Social Determinants of Health   Financial Resource Strain: Not on file  Food Insecurity: Not on file  Transportation Needs: Not on file  Physical Activity: Not on file  Stress: Not on file  Social Connections: Not on file  Intimate Partner Violence: Not on file     Review of Systems   Gen: Denies any fever, chills, fatigue, weight loss, lack of appetite.  CV: Denies chest pain, heart palpitations, peripheral edema, syncope.  Resp: Denies shortness of breath at rest or with exertion. Denies wheezing or cough.  GI: see HPI GU : Denies urinary burning, urinary frequency, urinary hesitancy MS: Denies joint pain, muscle weakness, cramps, or limitation of movement.  Derm: Denies rash, itching, dry skin Psych: Denies depression, anxiety, memory loss, and confusion Heme: Denies bruising, bleeding, and enlarged lymph nodes.   Physical Exam   BP 130/78    Pulse 77   Temp (!) 97.4 F (36.3 C)   Ht 5\' 6"  (1.676 m)   Wt 157 lb 3.2 oz (71.3 kg)   LMP 01/27/2022   BMI 25.37 kg/m   General:   Alert and oriented. Pleasant and cooperative. Well-nourished and well-developed.  Head:  Normocephalic and atraumatic. Eyes:  Without icterus, sclera clear and conjunctiva pink.  Ears:  Normal auditory acuity. Mouth:  No deformity or lesions, oral mucosa pink.  Lungs:  Clear to auscultation bilaterally. No wheezes, rales, or rhonchi. No distress.  Heart:  S1,  S2 present without murmurs appreciated.  Abdomen:  +BS, soft, non-distended. Mild ttp to epigastrium. No HSM noted. No guarding or rebound. No masses appreciated.  Rectal:  Deferred  Msk:  Symmetrical without gross deformities. Normal posture. Extremities:  Without edema. Neurologic:  Alert and  oriented x4;  grossly normal neurologically. Skin:  Intact without significant lesions or rashes. Psych:  Alert and cooperative. Normal mood and affect.   Assessment   Brittney Reyes is a 51 y.o. female with a history of anxiety, hydrocephalus, HTN presenting today for evaluation prior to scheduling screening colonoscopy and diarrhea.   Screening for colon cancer: Denies any family history of colon cancer or colon polyps.  She reports she had a colonoscopy in the past and thinks she may have been told she had some polyps but she is unsure.  She states she is unsure if Dr. Allena Katz or Dr. Sarajane Marek in [place] performed at or if it was performed by Dr. Greggory Stallion in [place], [place] after she had her gallbladder removed.  We will request records from both of these places to discern whether or not she has actually had a colonoscopy.  Weight loss and diarrhea as stated below.  Otherwise she denies any melena, hematochezia, abdominal pain, nausea/vomiting, lack of appetite, early satiety, reflux, dysphagia, chest pain, shortness of breath, edema.  Regardless, she is at the recommended age for screening colonoscopy,  therefore we will proceed with colonoscopy with Dr. Marletta Lor in the near future.  Diarrhea: Likely secondary to metformin. Symptoms began just after first dose and have continued.  Typically suffer from constipation and used to take daily fiber and occasional laxatives. Started on this medication about 1 month prior.  Also has had some associated gas pains.  History of gallbladder removal about 5-10 years ago but has denied any looser stools after this.  Typically has been able to eat what ever she wants without any issue.  She has follow-up with her PCP in about a month to discuss her diarrhea and continuing metformin or trying a different medication.  She did have some tenderness to palpation to her epigastrium on exam but notes that this feels like gas pains.  Weight loss: Reports about a 30 pound weight loss over the last 2 years without trying.  Reports that 10 of those last pounds has been in the last month since starting metformin.  Current weight is about 157 pounds.  Previously was 162 pounds at her PCP visit on 12/05/2021.  Last weight at prior PCP office in September 2021 was 171 pounds.  In May 2021 she was 175 pounds, but prior to this in March 2021 she was also 171 pounds then.  She would like to stay around the 155-160lb range.  She reports her eating habits have not changed.  She typically only eats 1 meal per day and has been doing that for a long time.  Most recent lab work performed with normal CBC, TSH.  CMP with low potassium 3.3, and elevated glucose.  Advised to continue to monitor weight and will address at follow-up post procedures.  Likely this most recent weight loss related to hydration/diarrhea and decreased blood sugar as a result of metformin.  PLAN   Proceed with colonoscopy with propofol by Dr. Marletta Lor in near future: the risks, benefits, and alternatives have been discussed with the patient in detail. The patient states understanding and desires to proceed. ASA 2 Hold metformin  night prior to and morning of.  Call with medication changes.  Records request for colonoscopy from Dr. Greggory Stallion in [place], [place] and Dr. Juanita Craver in [place], [place].  Follow up 2 months post procedure regarding weight loss.    Brooke Bonito, MSN, FNP-BC, AGACNP-BC [facility]

## 2022-02-27 NOTE — Patient Instructions (Addendum)
We are scheduling you for a colonoscopy in the near future with Dr. Abbey Chatters. You will need to hold metformin night prior to and morning of procedure.  If you have follow-up with your PCP prior to your colonoscopy and they make any changes to your diabetic medications, please contact the office and let us know so we can make recommendations.  We will request your previous colonoscopy records from either Dr. Iona Beard in [place] or Dr. Posey Pronto in [place].  I want you to follow-up 2 months after your procedure.  We can continue to monitor your weight at that time and pursue further work-up if needed.  It was a pleasure to see you today. I want to create trusting relationships with patients. If you receive a survey regarding your visit,  I greatly appreciate you taking time to fill this out on paper or through your MyChart. I value your feedback.  Venetia Night, MSN, FNP-BC, AGACNP-BC [facility]

## 2022-02-28 ENCOUNTER — Telehealth: Payer: Self-pay | Admitting: *Deleted

## 2022-02-28 ENCOUNTER — Encounter: Payer: Self-pay | Admitting: *Deleted

## 2022-02-28 DIAGNOSIS — Z1211 Encounter for screening for malignant neoplasm of colon: Secondary | ICD-10-CM

## 2022-02-28 MED ORDER — PEG 3350-KCL-NA BICARB-NACL 420 G PO SOLR: 4000.0000 mL | Freq: Once | ORAL | 0 refills | Status: AC

## 2022-02-28 NOTE — Telephone Encounter (Signed)
Pt returned call. Scheduled for 10/10 at 12pm. Aware will send instructions. Rx for prep sent to pharmacy. Pt will also needs UPT.

## 2022-02-28 NOTE — Telephone Encounter (Signed)
LMOVM to call back to schedule TCS with Dr. Carver ASA 2 ?

## 2022-03-07 ENCOUNTER — Ambulatory Visit (INDEPENDENT_AMBULATORY_CARE_PROVIDER_SITE_OTHER): Payer: 59 | Admitting: Family Medicine

## 2022-03-07 ENCOUNTER — Encounter: Payer: Self-pay | Admitting: Family Medicine

## 2022-03-07 ENCOUNTER — Ambulatory Visit (HOSPITAL_COMMUNITY)
Admission: RE | Admit: 2022-03-07 | Discharge: 2022-03-07 | Disposition: A | Discharge status: Home / Self Care | Payer: 59 | Source: Ambulatory Visit | Attending: Family Medicine | Admitting: Family Medicine

## 2022-03-07 VITALS — BP 134/84 | HR 78 | Ht 66.0 in | Wt 156.0 lb

## 2022-03-07 DIAGNOSIS — Z23 Encounter for immunization: Secondary | ICD-10-CM | POA: Diagnosis not present

## 2022-03-07 DIAGNOSIS — M79645 Pain in left finger(s): Secondary | ICD-10-CM

## 2022-03-07 DIAGNOSIS — F320 Major depressive disorder, single episode, mild: Secondary | ICD-10-CM

## 2022-03-07 DIAGNOSIS — G8929 Other chronic pain: Secondary | ICD-10-CM | POA: Insufficient documentation

## 2022-03-07 DIAGNOSIS — H539 Unspecified visual disturbance: Secondary | ICD-10-CM

## 2022-03-07 DIAGNOSIS — Z794 Long term (current) use of insulin: Secondary | ICD-10-CM | POA: Diagnosis not present

## 2022-03-07 DIAGNOSIS — I10 Essential (primary) hypertension: Secondary | ICD-10-CM | POA: Diagnosis not present

## 2022-03-07 DIAGNOSIS — R69 Illness, unspecified: Secondary | ICD-10-CM | POA: Diagnosis not present

## 2022-03-07 DIAGNOSIS — M19042 Primary osteoarthritis, left hand: Secondary | ICD-10-CM | POA: Diagnosis not present

## 2022-03-07 DIAGNOSIS — Z720 Tobacco use: Secondary | ICD-10-CM

## 2022-03-07 DIAGNOSIS — E119 Type 2 diabetes mellitus without complications: Secondary | ICD-10-CM

## 2022-03-07 DIAGNOSIS — I7300 Raynaud's syndrome without gangrene: Secondary | ICD-10-CM | POA: Diagnosis not present

## 2022-03-07 DIAGNOSIS — E559 Vitamin D deficiency, unspecified: Secondary | ICD-10-CM

## 2022-03-07 DIAGNOSIS — F419 Anxiety disorder, unspecified: Secondary | ICD-10-CM

## 2022-03-07 MED ORDER — METOPROLOL SUCCINATE ER 25 MG PO TB24: 25.0000 mg | ORAL_TABLET | Freq: Every day | ORAL | 1 refills | Status: DC

## 2022-03-07 MED ORDER — TRIAMTERENE-HCTZ 37.5-25 MG PO TABS: 1.0000 | ORAL_TABLET | Freq: Every day | ORAL | 2 refills | Status: DC

## 2022-03-07 NOTE — Assessment & Plan Note (Signed)
Will scheduled patient for a diabetic eye exam

## 2022-03-07 NOTE — Assessment & Plan Note (Signed)
.      03/07/2022   11:03 AM 03/07/2022   10:40 AM 12/05/2021    1:17 PM  Depression screen PHQ 2/9  Decreased Interest 0 0 0  Down, Depressed, Hopeless 0 0 0  PHQ - 2 Score 0 0 0  Altered sleeping 2    Tired, decreased energy 3    Change in appetite 3    Feeling bad or failure about yourself  0    Trouble concentrating 0    Moving slowly or fidgety/restless 2    Suicidal thoughts 0    PHQ-9 Score 10    Difficult doing work/chores Not difficult at all    She denies SI/HI She reports feeling and doing well

## 2022-03-07 NOTE — Assessment & Plan Note (Signed)
Lab Results  Component Value Date   HGBA1C 6.8 (H) 12/08/2021  She reports stopping metformin because of the side effects of diarrhea She denies the 3ps of diabetes. Pending labs
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
## 2022-03-07 NOTE — Patient Instructions (Signed)
I appreciate the opportunity to provide care to you today!    Follow up:  4 months  Labs: please stop by the lab during the week to get your blood drawn (CBC, CMP, TSH, Lipid profile, HgA1c, Vit D)  Please pick up your refills at the pharmacy   Please continue to a heart-healthy diet and increase your physical activities. Try to exercise for 30mins at least three times a week.      It was a pleasure to see you and I look forward to continuing to work together on your health and well-being. Please do not hesitate to call the office if you need care or have questions about your care.   Have a wonderful day and week. With Gratitude, Dezaree Tracey MSN, FNP-BC  

## 2022-03-07 NOTE — Assessment & Plan Note (Signed)
Informed patient to maintain body warmth and avoidance of cold exposure and sudden temperature changes

## 2022-03-07 NOTE — Assessment & Plan Note (Addendum)
8 cig Smokes about 8 cigarettes daily  Asked about quitting: confirms that she currently smokes cigarettes Advise to quit smoking: Educated about QUITTING to reduce the risk of cancer, cardio and cerebrovascular disease. Assess willingness: Unwilling to quit at this time, but is working on cutting back. Assist with counseling and pharmacotherapy: Counseled for 5 minutes and literature provided. Arrange for follow up: follow up in 3 months and continue to offer help.

## 2022-03-07 NOTE — Assessment & Plan Note (Signed)
GAD-7 is 16 She reports that her anxiety has heightened since moving in with her son and his children, reporting nine teenage children are in the home She declines pharmacological management at this time
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
## 2022-03-07 NOTE — Assessment & Plan Note (Signed)
Controlled BP is stable on metoprolol 25 mg daily She denies headaches, dizziness and chest pain

## 2022-03-08 DIAGNOSIS — E559 Vitamin D deficiency, unspecified: Secondary | ICD-10-CM | POA: Diagnosis not present

## 2022-03-08 DIAGNOSIS — E119 Type 2 diabetes mellitus without complications: Secondary | ICD-10-CM | POA: Diagnosis not present

## 2022-03-08 NOTE — Progress Notes (Signed)
Please inform the patient that the x-ray findings show no evidence of fracture or dislocation. 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

## 2022-03-10 LAB — CMP14+EGFR
ALT: 8 IU/L (ref 0–32)
AST: 12 IU/L (ref 0–40)
Albumin/Globulin Ratio: 2.2 (ref 1.2–2.2)
Albumin: 4.8 g/dL (ref 3.9–4.9)
Alkaline Phosphatase: 71 IU/L (ref 44–121)
BUN/Creatinine Ratio: 11 (ref 9–23)
BUN: 7 mg/dL (ref 6–24)
Bilirubin Total: <0.2 mg/dL (ref 0.0–1.2)
CO2: 22 mmol/L (ref 20–29)
Calcium: 9.2 mg/dL (ref 8.7–10.2)
Chloride: 100 mmol/L (ref 96–106)
Creatinine, Ser: 0.63 mg/dL (ref 0.57–1.00)
Globulin, Total: 2.2 g/dL (ref 1.5–4.5)
Glucose: 113 mg/dL — ABNORMAL HIGH (ref 70–99)
Potassium: 3.8 mmol/L (ref 3.5–5.2)
Sodium: 139 mmol/L (ref 134–144)
Total Protein: 7 g/dL (ref 6.0–8.5)
eGFR: 109 mL/min/{1.73_m2} (ref >59)

## 2022-03-10 LAB — TSH+FREE T4
Free T4: 1.23 ng/dL (ref 0.82–1.77)
TSH: 1.85 u[IU]/mL (ref 0.450–4.500)

## 2022-03-10 LAB — CBC WITH DIFFERENTIAL/PLATELET
Basophils Absolute: 0.1 10*3/uL (ref 0.0–0.2)
Basos: 1 %
EOS (ABSOLUTE): 0.1 10*3/uL (ref 0.0–0.4)
Eos: 1 %
Hematocrit: 33.2 % — ABNORMAL LOW (ref 34.0–46.6)
Hemoglobin: 10.6 g/dL — ABNORMAL LOW (ref 11.1–15.9)
Immature Grans (Abs): 0 10*3/uL (ref 0.0–0.1)
Immature Granulocytes: 0 %
Lymphocytes Absolute: 2.2 10*3/uL (ref 0.7–3.1)
Lymphs: 28 %
MCH: 26.8 pg (ref 26.6–33.0)
MCHC: 31.9 g/dL (ref 31.5–35.7)
MCV: 84 fL (ref 79–97)
Monocytes Absolute: 0.6 10*3/uL (ref 0.1–0.9)
Monocytes: 7 %
Neutrophils Absolute: 4.9 10*3/uL (ref 1.4–7.0)
Neutrophils: 63 %
Platelets: 498 10*3/uL — ABNORMAL HIGH (ref 150–450)
RBC: 3.95 x10E6/uL (ref 3.77–5.28)
RDW: 14.8 % (ref 11.7–15.4)
WBC: 7.8 10*3/uL (ref 3.4–10.8)

## 2022-03-10 LAB — HEMOGLOBIN A1C
Est. average glucose Bld gHb Est-mCnc: 134 mg/dL
Hgb A1c MFr Bld: 6.3 % — ABNORMAL HIGH (ref 4.8–5.6)

## 2022-03-10 LAB — MICROALBUMIN / CREATININE URINE RATIO
Creatinine, Urine: 118.9 mg/dL
Microalb/Creat Ratio: 12 mg/g creat (ref 0–29)
Microalbumin, Urine: 13.8 ug/mL

## 2022-03-10 LAB — LIPID PANEL
Chol/HDL Ratio: 2.8 ratio (ref 0.0–4.4)
Cholesterol, Total: 149 mg/dL (ref 100–199)
HDL: 54 mg/dL (ref >39)
LDL Chol Calc (NIH): 71 mg/dL (ref 0–99)
Triglycerides: 135 mg/dL (ref 0–149)
VLDL Cholesterol Cal: 24 mg/dL (ref 5–40)

## 2022-03-10 LAB — VITAMIN D 25 HYDROXY (VIT D DEFICIENCY, FRACTURES): Vit D, 25-Hydroxy: 36.4 ng/mL (ref 30.0–100.0)

## 2022-03-13 ENCOUNTER — Telehealth: Payer: Self-pay

## 2022-03-13 NOTE — Progress Notes (Signed)
Please encourage the patient to increase her intake of iron-rich foods, as her hemoglobin is slightly low.   She is now considered prediabetic because her hga1c has decreased from 6.8 to 6.3

## 2022-03-13 NOTE — Telephone Encounter (Signed)
Called pt went over labs/xray results.

## 2022-03-13 NOTE — Progress Notes (Signed)
Please advise keeping her hands warm with appropriate clothing and performing hand massages to relieve pain.

## 2022-03-13 NOTE — Telephone Encounter (Signed)
Patient returning lab result call 

## 2022-03-15 ENCOUNTER — Encounter: Payer: Self-pay | Admitting: Family Medicine

## 2022-03-15 ENCOUNTER — Ambulatory Visit (INDEPENDENT_AMBULATORY_CARE_PROVIDER_SITE_OTHER): Payer: 59 | Admitting: Family Medicine

## 2022-03-15 ENCOUNTER — Ambulatory Visit (HOSPITAL_COMMUNITY)
Admission: RE | Admit: 2022-03-15 | Discharge: 2022-03-15 | Disposition: A | Discharge status: Home / Self Care | Payer: 59 | Source: Ambulatory Visit | Attending: Family Medicine | Admitting: Family Medicine

## 2022-03-15 VITALS — BP 124/78 | HR 81 | Ht 66.5 in | Wt 157.0 lb

## 2022-03-15 DIAGNOSIS — M25572 Pain in left ankle and joints of left foot: Secondary | ICD-10-CM | POA: Diagnosis not present
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
## 2022-03-15 NOTE — Assessment & Plan Note (Signed)
Likely left ankle sprain Will get imaging studies to rule out fractures or dislocation Encourage conservative measurements with protection, rest, ice, compression, and elevation Encouraged to take Tylenol as needed for pain Will consider physical therapy if pain persist longer than 3 weeks

## 2022-03-15 NOTE — Patient Instructions (Addendum)
I appreciate the opportunity to provide care to you today!    Follow up:  06/14/2022  Initial management goals are to limit pain and swelling and to maintain range of motion   Protection, rest, ice, compression, and elevation (PRICE) is a common-sense approach for the first two to three days   Take tylenol as needed for pain For 2-3 days, keep your ankle raised (elevated) above the level of your heart. put ice on the injured area:  Put ice in a plastic bag. Place a towel between your skin and the bag. Leave the ice on for 15-20 minutes,  2-3 times a day or every 2-3 hrs while awake for 48 hrs or until swelling improves. -Protection is provided by applying an elastic bandage -Rest is achieved by limiting weightbearing -Ice or cold water immersion is recommended for 15 to 20 minutes every two to three hours while awake for the first 48 hours or until swelling is improved, whichever comes first -Compression with an elastic bandage to provide support and minimize swelling should be applied early -The injured ankle should be kept elevated to further alleviate swelling. Ideally, the ankle should be kept above the level of the heart, but this may be difficult to achieve.  Please stop by [facility] hospital anytime to get an x-ray of the left ankle     Please continue to a heart-healthy diet and increase your physical activities. Try to exercise for 43mins at least three times a week.      It was a pleasure to see you and I look forward to continuing to work together on your health and well-being. Please do not hesitate to call the office if you need care or have questions about your care.   Have a wonderful day and week. With Gratitude, Alvira Monday MSN, FNP-BC

## 2022-03-17 NOTE — Progress Notes (Signed)
Please inform the patient that the x-ray of her ankle was normal.  There was no fracture, dislocation, or bone destruction noted.  Continue conservative measurements as discussed during her appointment.

## 2022-03-20 ENCOUNTER — Other Ambulatory Visit (HOSPITAL_COMMUNITY)
Admission: RE | Admit: 2022-03-20 | Discharge: 2022-03-20 | Disposition: A | Discharge status: Home / Self Care | Payer: 59 | Source: Ambulatory Visit | Attending: Internal Medicine | Admitting: Internal Medicine

## 2022-03-20 DIAGNOSIS — Z1211 Encounter for screening for malignant neoplasm of colon: Secondary | ICD-10-CM | POA: Insufficient documentation

## 2022-03-20 LAB — PREGNANCY, URINE: Preg Test, Ur: NEGATIVE

## 2022-03-21 ENCOUNTER — Ambulatory Visit (HOSPITAL_COMMUNITY): Payer: 59 | Admitting: Anesthesiology

## 2022-03-21 ENCOUNTER — Ambulatory Visit (HOSPITAL_BASED_OUTPATIENT_CLINIC_OR_DEPARTMENT_OTHER): Payer: 59 | Admitting: Anesthesiology

## 2022-03-21 ENCOUNTER — Encounter (HOSPITAL_COMMUNITY)
Admission: RE | Disposition: A | Discharge status: Home / Self Care | Payer: Self-pay | Source: Home / Self Care | Attending: Internal Medicine

## 2022-03-21 ENCOUNTER — Other Ambulatory Visit: Payer: Self-pay

## 2022-03-21 ENCOUNTER — Encounter (HOSPITAL_COMMUNITY): Payer: Self-pay

## 2022-03-21 ENCOUNTER — Ambulatory Visit (HOSPITAL_COMMUNITY)
Admission: RE | Admit: 2022-03-21 | Discharge: 2022-03-21 | Disposition: A | Discharge status: Home / Self Care | Payer: 59 | Attending: Internal Medicine | Admitting: Internal Medicine

## 2022-03-21 ENCOUNTER — Telehealth: Payer: Self-pay | Admitting: Gastroenterology

## 2022-03-21 DIAGNOSIS — R12 Heartburn: Secondary | ICD-10-CM | POA: Diagnosis not present

## 2022-03-21 DIAGNOSIS — F419 Anxiety disorder, unspecified: Secondary | ICD-10-CM | POA: Diagnosis not present

## 2022-03-21 DIAGNOSIS — K648 Other hemorrhoids: Secondary | ICD-10-CM | POA: Diagnosis not present

## 2022-03-21 DIAGNOSIS — Z9049 Acquired absence of other specified parts of digestive tract: Secondary | ICD-10-CM | POA: Diagnosis not present

## 2022-03-21 DIAGNOSIS — Z982 Presence of cerebrospinal fluid drainage device: Secondary | ICD-10-CM | POA: Insufficient documentation

## 2022-03-21 DIAGNOSIS — G4730 Sleep apnea, unspecified: Secondary | ICD-10-CM | POA: Diagnosis not present

## 2022-03-21 DIAGNOSIS — G911 Obstructive hydrocephalus: Secondary | ICD-10-CM | POA: Diagnosis not present

## 2022-03-21 DIAGNOSIS — Z7984 Long term (current) use of oral hypoglycemic drugs: Secondary | ICD-10-CM | POA: Diagnosis not present

## 2022-03-21 DIAGNOSIS — R002 Palpitations: Secondary | ICD-10-CM | POA: Diagnosis not present

## 2022-03-21 DIAGNOSIS — F17210 Nicotine dependence, cigarettes, uncomplicated: Secondary | ICD-10-CM | POA: Diagnosis not present

## 2022-03-21 DIAGNOSIS — I10 Essential (primary) hypertension: Secondary | ICD-10-CM

## 2022-03-21 DIAGNOSIS — R634 Abnormal weight loss: Secondary | ICD-10-CM | POA: Diagnosis not present

## 2022-03-21 DIAGNOSIS — R6881 Early satiety: Secondary | ICD-10-CM | POA: Diagnosis not present

## 2022-03-21 DIAGNOSIS — Z1211 Encounter for screening for malignant neoplasm of colon: Secondary | ICD-10-CM | POA: Diagnosis not present

## 2022-03-21 DIAGNOSIS — G4733 Obstructive sleep apnea (adult) (pediatric): Secondary | ICD-10-CM | POA: Insufficient documentation

## 2022-03-21 DIAGNOSIS — F32A Depression, unspecified: Secondary | ICD-10-CM | POA: Diagnosis not present

## 2022-03-21 DIAGNOSIS — E119 Type 2 diabetes mellitus without complications: Secondary | ICD-10-CM | POA: Diagnosis not present

## 2022-03-21 DIAGNOSIS — Z79899 Other long term (current) drug therapy: Secondary | ICD-10-CM | POA: Insufficient documentation

## 2022-03-21 DIAGNOSIS — R197 Diarrhea, unspecified: Secondary | ICD-10-CM | POA: Diagnosis not present

## 2022-03-21 DIAGNOSIS — Z6824 Body mass index (BMI) 24.0-24.9, adult: Secondary | ICD-10-CM | POA: Insufficient documentation

## 2022-03-21 DIAGNOSIS — Z1212 Encounter for screening for malignant neoplasm of rectum: Secondary | ICD-10-CM

## 2022-03-21 DIAGNOSIS — R143 Flatulence: Secondary | ICD-10-CM | POA: Insufficient documentation

## 2022-03-21 HISTORY — PX: COLONOSCOPY WITH PROPOFOL: SHX5780

## 2022-03-21 SURGERY — COLONOSCOPY WITH PROPOFOL
Anesthesia: General

## 2022-03-21 MED ORDER — LACTATED RINGERS IV SOLN: INTRAVENOUS | Status: DC

## 2022-03-21 MED ORDER — PROPOFOL 10 MG/ML IV BOLUS
INTRAVENOUS | Status: DC | PRN
  Administered 2022-03-21: 100 mg via INTRAVENOUS
  Administered 2022-03-21 (×4): 50 mg via INTRAVENOUS

## 2022-03-21 MED ORDER — LIDOCAINE HCL (CARDIAC) PF 100 MG/5ML IV SOSY
PREFILLED_SYRINGE | INTRAVENOUS | Status: DC | PRN
  Administered 2022-03-21: 50 mg via INTRAVENOUS

## 2022-03-21 NOTE — Transfer of Care (Signed)
Immediate Anesthesia Transfer of Care Note  Patient: Brittney Reyes  Procedure(s) Performed: COLONOSCOPY WITH PROPOFOL  Patient Location: Endoscopy Unit  Anesthesia Type:General  Level of Consciousness: awake  Airway & Oxygen Therapy: Patient Spontanous Breathing  Post-op Assessment: Report given to RN and Post -op Vital signs reviewed and stable  Post vital signs: Reviewed and stable  Last Vitals:  Vitals Value Taken Time  BP 105/55   Temp 36.6 C 03/21/22 1004  Pulse 60 03/21/22 1004  Resp 16 03/21/22 1004  SpO2 96 % 03/21/22 1004    Last Pain:  Vitals:   03/21/22 1004  TempSrc: Oral  PainSc: 0-No pain      Patients Stated Pain Goal: 7 (74/16/38 4536)  Complications: No notable events documented.
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
## 2022-03-21 NOTE — Telephone Encounter (Signed)
Records request reviewed.  Per request to [facility] gastroenterology, they report the patient has never been seen in the office and there are no records to locate.  Also noted to not have any records for colonoscopy with Dr. Iona Beard in [place], [place].  Venetia Night, MSN, APRN, FNP-BC, AGACNP-BC [facility] at [facility]

## 2022-03-21 NOTE — Anesthesia Postprocedure Evaluation (Signed)
Anesthesia Post Note  Patient: Brittney Reyes  Procedure(s) Performed: COLONOSCOPY WITH PROPOFOL  Patient location during evaluation: Phase II Anesthesia Type: General Level of consciousness: awake and alert and oriented Pain management: pain level controlled Vital Signs Assessment: post-procedure vital signs reviewed and stable Respiratory status: spontaneous breathing, nonlabored ventilation and respiratory function stable Cardiovascular status: blood pressure returned to baseline and stable Postop Assessment: no apparent nausea or vomiting Anesthetic complications: no   No notable events documented.   Last Vitals:  Vitals:   03/21/22 0829 03/21/22 1004  BP: 121/66 (!) 105/55  Pulse: (!) 55 60  Resp: 14 16  Temp: 36.9 C 36.6 C  SpO2: 99% 96%    Last Pain:  Vitals:   03/21/22 1004  TempSrc: Oral  PainSc: 0-No pain                 Gregery Walberg C Caitlain Tweed

## 2022-03-21 NOTE — Interval H&P Note (Signed)
History and Physical Interval Note:  03/21/2022 9:34 AM  Brittney Reyes  has presented today for surgery, with the diagnosis of screening.  The various methods of treatment have been discussed with the patient and family. After consideration of risks, benefits and other options for treatment, the patient has consented to  Procedure(s) with comments: COLONOSCOPY WITH PROPOFOL (N/A) - 12:00pm, asa 2, pt knows to arrive at 8:00 as a surgical intervention.  The patient's history has been reviewed, patient examined, no change in status, stable for surgery.  I have reviewed the patient's chart and labs.  Questions were answered to the patient's satisfaction.     Eloise Harman

## 2022-03-21 NOTE — Op Note (Signed)
[facility] Patient Name: Brittney Reyes Procedure Date: 03/21/2022 9:35 AM MRN: [id number] Date of Birth: [birth year] Attending MD: Elon Alas. Abbey Chatters DO CSN: [id number] Age: 51 Admit Type: Outpatient Procedure:                Colonoscopy Indications:              Screening for colorectal malignant neoplasm Providers:                Elon Alas. Abbey Chatters, DO, Caprice Kluver, [place], Technician Referring MD:              Medicines:                See the Anesthesia note for documentation of the                            administered medications Complications:            No immediate complications. Estimated Blood Loss:     Estimated blood loss: none. Procedure:                Pre-Anesthesia Assessment:                           - The anesthesia plan was to use monitored                            anesthesia care (MAC).                           After obtaining informed consent, the colonoscope                            was passed under direct vision. Throughout the                            procedure, the patient's blood pressure, pulse, and                            oxygen saturations were monitored continuously. The                            [id number]-[id number] ([id number]) scope was introduced through                            the anus and advanced to the the cecum, identified                            by appendiceal orifice and ileocecal valve. The                            colonoscopy was performed without difficulty. The                            patient tolerated the procedure well. The quality  of the bowel preparation was evaluated using the                            BBPS [facility] Bowel Preparation Scale) with scores                            of: Right Colon = 2 (minor amount of residual                            staining, small fragments of stool and/or opaque                            liquid, but mucosa seen  well), Transverse Colon = 3                            (entire mucosa seen well with no residual staining,                            small fragments of stool or opaque liquid) and Left                            Colon = 2 (minor amount of residual staining, small                            fragments of stool and/or opaque liquid, but mucosa                            seen well). The total BBPS score equals 7. The                            quality of the bowel preparation was good. Scope In: 9:45:54 AM Scope Out: 10:00:13 AM Scope Withdrawal Time: 0 hours 9 minutes 32 seconds  Total Procedure Duration: 0 hours 14 minutes 19 seconds  Findings:      The perianal and digital rectal examinations were normal.      Non-bleeding internal hemorrhoids were found during endoscopy.      The exam was otherwise without abnormality. Impression:               - Non-bleeding internal hemorrhoids.                           - The examination was otherwise normal.                           - No specimens collected. Moderate Sedation:      Per Anesthesia Care Recommendation:           - Patient has a contact number available for                            emergencies. The signs and symptoms of potential                            delayed complications were  discussed with the                            patient. Return to normal activities tomorrow.                            Written discharge instructions were provided to the                            patient.                           - Resume previous diet.                           - Continue present medications.                           - Repeat colonoscopy in 10 years for screening                            purposes.                           - Return to GI clinic in 3 months. Procedure Code(s):        --- Professional ---                           Z7673, Colorectal cancer screening; colonoscopy on                            individual not  meeting criteria for high risk Diagnosis Code(s):        --- Professional ---                           Z12.11, Encounter for screening for malignant                            neoplasm of colon                           K64.8, Other hemorrhoids CPT copyright 2019 American Medical Association. All rights reserved. The codes documented in this report are preliminary and upon coder review may  be revised to meet current compliance requirements. Elon Alas. Abbey Chatters, DO [place] Alisse Tuite, DO 03/21/2022 10:02:39 AM This report has been signed electronically. Number of Addenda: 0

## 2022-03-21 NOTE — Discharge Instructions (Addendum)
  Colonoscopy Discharge Instructions  Read the instructions outlined below and refer to this sheet in the next few weeks. These discharge instructions provide you with general information on caring for yourself after you leave the hospital. Your doctor may also give you specific instructions. While your treatment has been planned according to the most current medical practices available, unavoidable complications occasionally occur.   ACTIVITY You may resume your regular activity, but move at a slower pace for the next 24 hours.  Take frequent rest periods for the next 24 hours.  Walking will help get rid of the air and reduce the bloated feeling in your belly (abdomen).  No driving for 24 hours (because of the medicine (anesthesia) used during the test).   Do not sign any important legal documents or operate any machinery for 24 hours (because of the anesthesia used during the test).  NUTRITION Drink plenty of fluids.  You may resume your normal diet as instructed by your doctor.  Begin with a light meal and progress to your normal diet. Heavy or fried foods are harder to digest and may make you feel sick to your stomach (nauseated).  Avoid alcoholic beverages for 24 hours or as instructed.  MEDICATIONS You may resume your normal medications unless your doctor tells you otherwise.  WHAT YOU CAN EXPECT TODAY Some feelings of bloating in the abdomen.  Passage of more gas than usual.  Spotting of blood in your stool or on the toilet paper.  IF YOU HAD POLYPS REMOVED DURING THE COLONOSCOPY: No aspirin products for 7 days or as instructed.  No alcohol for 7 days or as instructed.  Eat a soft diet for the next 24 hours.  FINDING OUT THE RESULTS OF YOUR TEST Not all test results are available during your visit. If your test results are not back during the visit, make an appointment with your caregiver to find out the results. Do not assume everything is normal if you have not heard from your  caregiver or the medical facility. It is important for you to follow up on all of your test results.  SEEK IMMEDIATE MEDICAL ATTENTION IF: You have more than a spotting of blood in your stool.  Your belly is swollen (abdominal distention).  You are nauseated or vomiting.  You have a temperature over 101.  You have abdominal pain or discomfort that is severe or gets worse throughout the day.   Your colonoscopy was relatively unremarkable.  I did not find any polyps or evidence of colon cancer.  I recommend repeating colonoscopy in 10 years for colon cancer screening purposes.  Follow-up with GI in 2-3 months   I hope you have a great rest of your week!  Charles K. Carver, D.O. Gastroenterology and Hepatology [facility]  

## 2022-03-27 ENCOUNTER — Encounter (HOSPITAL_COMMUNITY): Payer: Self-pay | Admitting: Internal Medicine

## 2022-04-04 ENCOUNTER — Ambulatory Visit: Payer: 59

## 2022-04-04 LAB — HM DIABETES EYE EXAM

## 2022-06-19 NOTE — Progress Notes (Unsigned)
GI Office Note    Referring Provider: Gilmore Laroche, FNP Primary Care Physician:  Gilmore Laroche, FNP Primary Gastroenterologist: Hennie Duos. Marletta Lor, DO  Date:  06/20/2022  ID:  Brittney Reyes, DOB 1971/02/26, MRN [id number]   Chief Complaint   Chief Complaint  Patient presents with   Follow-up    Having some gas    History of Present Illness  Brittney Reyes is a 52 y.o. female with a history of anxiety, hydrocephalus, HTN presenting today for follow-up post procedure and diarrhea/weight loss.  Last seen for initial consultation 02/27/22. Reported diarrhea that began after first dose of metformin.  Typically suffers from constipation taking daily fiber and occasional accidents.  History of cholecystectomy 5-10 years prior but denied looser stools after this.  Also stated a 30 pound weight loss over 2 years without trying.  Stated 10 of those last pounds have been within the last month since starting metformin.  Weight was noted to be 171 pounds at PCP office in September 2021.  Weight in the office this day was 157 pounds.  Prior TCS records requested.  Not available with [facility] gastroenterology or office from [place].  Scheduled for colonoscopy.  No alarm symptoms present other than weight loss.  Labs 03/08/2022: Hemoglobin 10.6, MCV 84, platelets 498, normal LFTs and renal function. TSH 1.23, normal lipid panel, A1c 6.3.  Vitamin D 36.4.  Colonoscopy 03/21/2022: -Nonbleeding internal hemorrhoids -Exam otherwise normal -Repeat colonoscopy in 10 years -Office visit in 3 months  Today: Diarrhea: Stopped once she stopped metformin.   Lack of appetite: Still is having a lack of appetite. Eats only 1 meal per day. Was having a lot of stress at her job and that is when her weight loss started as well. Has been eating 1 meal a day since high school. Every once in a while she will have snacks or a small meal. States she gets sick in the mornings if she eats too soon. Gets very  nauseas if she does try to eat too soon.   Weight loss: Holding steady at mid 150s. States her stable weight is 170-185. Did not try to lose the weight. Possibly medication related.   Wt Readings from Last 3 Encounters:  06/20/22 156 lb 3.2 oz (70.9 kg)  03/21/22 157 lb 0.6 oz (71.2 kg)  03/15/22 157 lb 0.6 oz (71.2 kg)  Reportedly 171 pounds at PCP office visit in September 2021.  Diabetes - goes to see PCP in 2 weeks. 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 Reports her A1c is coming down.   Staying busy with home remodels.    Current Outpatient Medications  Medication Sig Dispense Refill   metoprolol succinate (TOPROL-XL) 25 MG 24 hr tablet Take 1 tablet (25 mg total) by mouth daily. 90 tablet 1   rosuvastatin (CRESTOR) 10 MG tablet Take 1 tablet (10 mg total) by mouth daily. 90 tablet 3   triamterene-hydrochlorothiazide (MAXZIDE-25) 37.5-25 MG tablet Take 1 tablet by mouth daily. 90 tablet 2   acetaminophen (TYLENOL) 500 MG tablet Take 1,000 mg by mouth daily as needed for headache.     cetirizine (ZYRTEC) 10 MG tablet Take 10 mg by mouth daily. (Patient not taking: Reported on 06/20/2022)     diphenhydrAMINE (BENADRYL) 25 MG tablet Take 25 mg by mouth every 6 (six) hours as needed for allergies. (Patient not taking: Reported on 06/20/2022)     fluticasone (FLONASE) 50 MCG/ACT nasal  spray Place 1 spray into both nostrils daily as needed for allergies. (Patient not taking: Reported on 06/20/2022)     metFORMIN (GLUCOPHAGE) 500 MG tablet Take 1 tablet (500 mg total) by mouth 2 (two) times daily with a meal. (Patient not taking: Reported on 03/16/2022) 180 tablet 3   No current facility-administered medications for this visit.    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)   TONSILLECTOMY      Family History  Problem Relation Age of Onset   Diabetes Mother    Hypertension Mother    Hypertension Father    Migraines Daughter    Asthma Child    Colon cancer Neg Hx    Colon polyps Neg Hx     Allergies as of 06/20/2022 - Review Complete 06/20/2022  Allergen Reaction Noted   Duloxetine Hives 09/24/2013   Fluoxetine Nausea And Vomiting 09/29/2015   Lactose intolerance (gi) Cough, Diarrhea, Hives, Itching, Palpitations, Rash, Shortness Of Breath, and Swelling 03/16/2022   Milk (cow) Hives 09/24/2013   Gabapentin Other (See Comments) 07/06/2016   Prozac [fluoxetine hcl] Nausea And Vomiting and Swelling 05/15/2013   Wellbutrin [bupropion] Hives 05/15/2013   Gluten meal Itching 08/19/2020    Social History   Socioeconomic History   Marital status: Married    Spouse name: Not on file   Number of children: Not on file   Years of education: Not on file   Highest education level: Not on file  Occupational History   Not on file  Tobacco Use   Smoking status: Every Day    Packs/day: 1.00    Years: 25.00    Total pack years: 25.00    Types: Cigarettes   Smokeless tobacco: Never  Vaping Use   Vaping Use: Never used  Substance and Sexual Activity   Alcohol use: No   Drug use: No   Sexual activity: Not on file  Other Topics Concern   Not on file  Social History Narrative   Not on file   Social Determinants of Health   Financial Resource Strain: Not on file  Food Insecurity: Not on file  Transportation Needs: Not on file  Physical Activity: Not on file  Stress: Not on file  Social Connections: Not on file     Review of Systems   Gen: Denies fever, chills, anorexia.  Denies fatigue, weakness, weight loss.  CV: Denies chest pain, palpitations, syncope, peripheral edema, and claudication. Resp: Denies dyspnea at rest, cough, wheezing, coughing up blood, and pleurisy. GI: See HPI Derm: Denies rash, itching, dry skin Psych: Denies depression, anxiety, memory loss, confusion. No homicidal or suicidal ideation.  Heme: Denies bruising, bleeding, and enlarged lymph nodes.   Physical Exam   BP 122/76 (BP Location: Right Arm, Patient Position: Sitting, Cuff Size: Normal)   Pulse 72   Temp 97.6 F (36.4 C) (Temporal)   Ht 5\' 6"  (1.676 m)   Wt 156 lb 3.2 oz (70.9 kg)  LMP 06/08/2022 (Approximate)   SpO2 97%   BMI 25.21 kg/m   General:   Alert and oriented. No distress noted. Pleasant and cooperative.  Head:  Normocephalic and atraumatic. Eyes:  Conjuctiva clear without scleral icterus. Mouth:  Oral mucosa pink and moist. Good dentition. No lesions. Lungs:  Clear to auscultation bilaterally. No wheezes, rales, or rhonchi. No distress.  Heart:  S1, S2 present without murmurs appreciated.  Abdomen:  +BS, soft, non-tender and non-distended. No rebound or guarding. No HSM or masses noted. Rectal: deferred Msk:  Symmetrical without gross deformities. Normal posture. Extremities:  Without edema. Neurologic:  Alert and  oriented x4 Psych:  Alert and cooperative. Normal mood and affect.   Assessment  Brittney Reyes is a 52 y.o. female with a history of anxiety, hydrocephalus, HTN presenting today for follow-up post procedure and diarrhea/weight loss.  Screening for colon cancer: Recent TCS in October completely normal.  Repeat due in 2033 (10 years).   Diarrhea: No biopsies taken on recent colonoscopy. Has resolved since stopping metformin.   Weight loss, lack of appetite: Weight has been stable for the last 4 months. No changes in appetite. Has chronic lack of appetite and usually gets one meal per day with occasional snacking in between.  Has done this  since high school.  Was at 185 pounds at her highest, 171 lbs in 2021.  Appears weight drop started during COVID when she was under lots of stress.  Will continue to monitor for now provided high-protein/high-calorie diet handout for review in case she were to begin losing weight.  She is aware to notify for any significant weight loss that occurs rapidly.  PLAN   Continue to monitor weight weekly. Notify for more than 5-15% drop in weight within a couple months.  Recommended extra protein supplementation and to contact office if further weight loss occurs for additional evaluation.  Follow up in 6 months or sooner if needed.    Venetia Night, MSN, FNP-BC, AGACNP-BC [facility]

## 2022-06-20 ENCOUNTER — Ambulatory Visit (INDEPENDENT_AMBULATORY_CARE_PROVIDER_SITE_OTHER): Payer: Medicaid Other | Admitting: Gastroenterology

## 2022-06-20 ENCOUNTER — Encounter: Payer: Self-pay | Admitting: Gastroenterology

## 2022-06-20 VITALS — BP 122/76 | HR 72 | Temp 97.6°F | Ht 66.0 in | Wt 156.2 lb

## 2022-06-20 DIAGNOSIS — Z1211 Encounter for screening for malignant neoplasm of colon: Secondary | ICD-10-CM | POA: Diagnosis not present

## 2022-06-20 DIAGNOSIS — R197 Diarrhea, unspecified: Secondary | ICD-10-CM

## 2022-06-20 DIAGNOSIS — R634 Abnormal weight loss: Secondary | ICD-10-CM | POA: Diagnosis not present

## 2022-06-20 NOTE — Patient Instructions (Signed)
Continue to monitor weight weekly.  Please notify for more than a 10-15% drop in her weight within a couple months (15+ lbs).  With your weekly weights and may fluctuate between 1 to 3 pounds and this is normal.  I have attached a handout regarding high-protein high-calorie diet.  Given your chronic lack of appetite and may be helpful for you to supplement with a lecture protein in your diet however I would save any protein shakes/bars for after your primary meal of the day.  Even some smaller light snacking throughout the day could be beneficial for you.  Please let me know if you have any return of diarrhea.  Will plan to see you in 6 months, or sooner if needed for any new issues.  It was a pleasure to see you today. I want to create trusting relationships with patients. If you receive a survey regarding your visit,  I greatly appreciate you taking time to fill this out on paper or through your MyChart. I value your feedback.  Venetia Night, MSN, FNP-BC, AGACNP-BC [facility]

## 2022-06-21 ENCOUNTER — Ambulatory Visit: Payer: 59 | Admitting: Gastroenterology

## 2022-07-04 ENCOUNTER — Encounter: Payer: Self-pay | Admitting: Family Medicine

## 2022-07-04 ENCOUNTER — Ambulatory Visit (HOSPITAL_COMMUNITY)
Admission: RE | Admit: 2022-07-04 | Discharge: 2022-07-04 | Disposition: A | Discharge status: Home / Self Care | Payer: Medicaid Other | Source: Ambulatory Visit | Attending: Family Medicine | Admitting: Family Medicine

## 2022-07-04 ENCOUNTER — Ambulatory Visit: Payer: Medicaid Other | Admitting: Family Medicine

## 2022-07-04 VITALS — BP 134/78 | HR 83 | Ht 66.5 in | Wt 158.0 lb

## 2022-07-04 DIAGNOSIS — G8929 Other chronic pain: Secondary | ICD-10-CM

## 2022-07-04 DIAGNOSIS — E559 Vitamin D deficiency, unspecified: Secondary | ICD-10-CM | POA: Diagnosis not present

## 2022-07-04 DIAGNOSIS — E785 Hyperlipidemia, unspecified: Secondary | ICD-10-CM | POA: Diagnosis not present

## 2022-07-04 DIAGNOSIS — I10 Essential (primary) hypertension: Secondary | ICD-10-CM | POA: Diagnosis not present

## 2022-07-04 DIAGNOSIS — E0789 Other specified disorders of thyroid: Secondary | ICD-10-CM | POA: Diagnosis not present

## 2022-07-04 DIAGNOSIS — M25561 Pain in right knee: Secondary | ICD-10-CM

## 2022-07-04 DIAGNOSIS — Z794 Long term (current) use of insulin: Secondary | ICD-10-CM | POA: Diagnosis not present

## 2022-07-04 DIAGNOSIS — M25562 Pain in left knee: Secondary | ICD-10-CM

## 2022-07-04 DIAGNOSIS — Z720 Tobacco use: Secondary | ICD-10-CM

## 2022-07-04 DIAGNOSIS — E119 Type 2 diabetes mellitus without complications: Secondary | ICD-10-CM

## 2022-07-04 MED ORDER — METOPROLOL SUCCINATE ER 25 MG PO TB24: 25.0000 mg | ORAL_TABLET | Freq: Every day | ORAL | 1 refills | Status: DC

## 2022-07-04 MED ORDER — TRIAMTERENE-HCTZ 37.5-25 MG PO TABS: 1.0000 | ORAL_TABLET | Freq: Every day | ORAL | 2 refills | Status: DC

## 2022-07-04 MED ORDER — ROSUVASTATIN CALCIUM 10 MG PO TABS: 10.0000 mg | ORAL_TABLET | Freq: Every day | ORAL | 3 refills | Status: DC

## 2022-07-04 MED ORDER — CAPSAICIN 0.025 % EX CREA: TOPICAL_CREAM | Freq: Two times a day (BID) | CUTANEOUS | 0 refills | Status: DC

## 2022-07-04 NOTE — Progress Notes (Signed)
Established Patient Office Visit  Subjective:  Patient ID: Brittney Reyes, female    DOB: 08-30-1970  Age: 52 y.o. MRN: [id number]  CC:  Chief Complaint  Patient presents with   Follow-up    4 month f/u, pt reports knees locking up on her since 04/12/2022    HPI Brittney Reyes is a 52 y.o. female with past medical history of hypertension, type 2 diabetes, anxiety, and hyperlipidemia presents for f/u of  chronic medical conditions. For the details of today's visit, please refer to the assessment and plan.     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)   TONSILLECTOMY      Family History  Problem Relation Age of Onset   Diabetes Mother    Hypertension Mother    Hypertension Father    Migraines Daughter    Asthma Child    Colon cancer Neg Hx    Colon polyps Neg Hx     Social History   Socioeconomic History   Marital status: Married    Spouse name: Not on file   Number of children: Not on file   Years of education: Not on file   Highest education level: Not on file  Occupational History   Not on file  Tobacco Use   Smoking status: Every Day    Packs/day: 1.00    Years: 25.00    Total pack years: 25.00    Types: Cigarettes   Smokeless tobacco: Never  Vaping Use   Vaping Use: Never used  Substance and Sexual Activity   Alcohol use: No   Drug use: No   Sexual activity: Not on file  Other Topics Concern   Not on file  Social History Narrative   Not on file   Social Determinants of Health   Financial Resource Strain: Not on  file  Food Insecurity: Not on file  Transportation Needs: Not on file  Physical Activity: Not on file  Stress: Not on file  Social Connections: Not on file  Intimate Partner Violence: Not on file    Outpatient Medications Prior to Visit  Medication Sig Dispense Refill   acetaminophen (TYLENOL) 500 MG tablet Take 1,000 mg by mouth daily as needed for headache.     metoprolol succinate (TOPROL-XL) 25 MG 24 hr tablet Take 1 tablet (25 mg total) by mouth daily. 90 tablet 1   rosuvastatin (CRESTOR) 10 MG tablet Take 1 tablet (10 mg total) by mouth daily. 90 tablet 3   triamterene-hydrochlorothiazide (MAXZIDE-25) 37.5-25 MG tablet Take 1 tablet by mouth daily. 90 tablet 2   No facility-administered medications prior to visit.    Allergies  Allergen Reactions   Duloxetine Hives   Fluoxetine Nausea And Vomiting   Lactose Intolerance (Gi) Cough, Diarrhea, Hives, Itching, Palpitations, Rash, Shortness Of Breath and Swelling   Milk (Cow) Hives   Gabapentin Other (See Comments)    Dizziness and slurring of words on this medication  Prozac [Fluoxetine Hcl] Nausea And Vomiting and Swelling   Wellbutrin [Bupropion] Hives   Gluten Meal Itching    ROS Review of Systems  Constitutional:  Negative for chills and fever.  Eyes:  Negative for visual disturbance.  Respiratory:  Negative for chest tightness and shortness of breath.   Musculoskeletal:        Bilateral knee pain  Neurological:  Negative for dizziness and headaches.      Objective:    Physical Exam HENT:     Head: Normocephalic.     Mouth/Throat:     Mouth: Mucous membranes are moist.  Cardiovascular:     Rate and Rhythm: Normal rate.     Heart sounds: Normal heart sounds.  Pulmonary:     Effort: Pulmonary effort is normal.     Breath sounds: Normal breath sounds.  Musculoskeletal:     Right knee: No swelling, deformity, effusion, erythema, ecchymosis or lacerations. Normal range of motion. No tenderness.     Left  knee: No swelling, deformity, effusion, erythema, ecchymosis or lacerations. Normal range of motion. No tenderness.  Neurological:     Mental Status: She is alert.     BP 134/78   Pulse 83   Ht 5' 6.5" (1.689 m)   Wt 158 lb (71.7 kg)   LMP 06/08/2022 (Approximate)   SpO2 97%   BMI 25.12 kg/m  Wt Readings from Last 3 Encounters:  07/04/22 158 lb (71.7 kg)  06/20/22 156 lb 3.2 oz (70.9 kg)  03/21/22 157 lb 0.6 oz (71.2 kg)    Lab Results  Component Value Date   TSH 1.850 03/08/2022   Lab Results  Component Value Date   WBC 7.8 03/08/2022   HGB 10.6 (L) 03/08/2022   HCT 33.2 (L) 03/08/2022   MCV 84 03/08/2022   PLT 498 (H) 03/08/2022   Lab Results  Component Value Date   NA 139 03/08/2022   K 3.8 03/08/2022   CO2 22 03/08/2022   GLUCOSE 113 (H) 03/08/2022   BUN 7 03/08/2022   CREATININE 0.63 03/08/2022   BILITOT <0.2 03/08/2022   ALKPHOS 71 03/08/2022   AST 12 03/08/2022   ALT 8 03/08/2022   PROT 7.0 03/08/2022   ALBUMIN 4.8 03/08/2022   CALCIUM 9.2 03/08/2022   EGFR 109 03/08/2022   Lab Results  Component Value Date   CHOL 149 03/08/2022   Lab Results  Component Value Date   HDL 54 03/08/2022   Lab Results  Component Value Date   LDLCALC 71 03/08/2022   Lab Results  Component Value Date   TRIG 135 03/08/2022   Lab Results  Component Value Date   CHOLHDL 2.8 03/08/2022   Lab Results  Component Value Date   HGBA1C 6.3 (H) 03/08/2022      Assessment & Plan:  Primary hypertension Assessment & Plan: Controlled BP is stable on metoprolol 25 mg daily, and Maxzide-25 mg daily She denies headaches, dizziness and chest pain Patient is asymptomatic today Will assess CBC and CMP BP Readings from Last 3 Encounters:  07/04/22 134/78  06/20/22 122/76  03/21/22 (!) 105/55     Orders: -     CMP14+EGFR -     CBC with Differential/Platelet -     Triamterene-HCTZ; Take 1 tablet by mouth daily.  Dispense: 90 tablet; Refill: 2 -     Metoprolol  Succinate ER; Take 1 tablet (25 mg total) by mouth daily.  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  Dispense: 60 g; Refill: 0  Dyslipidemia, goal LDL below 100 Assessment & Plan: She takes rosuvastatin 10 mg daily She denies muscle aches and pain Will assess lipid panel today Lab Results  Component Value Date   CHOL 149 03/08/2022   HDL 54 03/08/2022   LDLCALC 71 03/08/2022   TRIG 135 03/08/2022   CHOLHDL 2.8 03/08/2022     Orders: -     Lipid panel -     Rosuvastatin Calcium; Take 1 tablet (10 mg total) by mouth daily.  Dispense: 90 tablet; Refill: 3  Tobacco use  Type 2 diabetes mellitus without complication, with long-term current use of insulin (HCC) -     Hemoglobin A1c  Vitamin D deficiency -     VITAMIN D 25 Hydroxy (Vit-D Deficiency, Fractures)  Other specified disorders of thyroid -     TSH + free T4    Follow-up: Return in about 3 months (around 10/03/2022).   Alvira Monday, FNP

## 2022-07-04 NOTE — Patient Instructions (Addendum)
I appreciate the opportunity to provide care to you today!    Follow up:  3 months  Labs: please stop by the lab today/ during the week to get your blood drawn (CBC, CMP, TSH, Lipid profile, HgA1c, Vit D)  Please pick up your prescription at the pharmacy  Please stop by [facility] anytime to get an x-ray of  your knees  I recommend ongoing regular exercise for pain relief, functional improvement, and joint protection.    Physical activity helps: Lower your blood glucose, improve your heart health, lower your blood pressure and cholesterol, burn calories to help manage her weight, gave you energy, lower stress, and improve his sleep.  The American diabetes Association (ADA) recommends being active for 2-1/2 hours (150 minutes) or more week.  Exercise for 30 minutes, 5 days a week (150 minutes total)    Please continue to a heart-healthy diet.   It was a pleasure to see you and I look forward to continuing to work together on your health and well-being. Please do not hesitate to call the office if you need care or have questions about your care.   Have a wonderful day and week. With Gratitude, Alvira Monday MSN, FNP-BC

## 2022-07-04 NOTE — Assessment & Plan Note (Signed)
She takes rosuvastatin 10 mg daily She denies muscle aches and pain Will assess lipid panel today Lab Results  Component Value Date   CHOL 149 03/08/2022   HDL 54 03/08/2022   LDLCALC 71 03/08/2022   TRIG 135 03/08/2022   CHOLHDL 2.8 03/08/2022

## 2022-07-04 NOTE — Assessment & Plan Note (Signed)
Controlled BP is stable on metoprolol 25 mg daily, and Maxzide-25 mg daily She denies headaches, dizziness and chest pain Patient is asymptomatic today Will assess CBC and CMP BP Readings from Last 3 Encounters:  07/04/22 134/78  06/20/22 122/76  03/21/22 (!) 105/55

## 2022-07-04 NOTE — Assessment & Plan Note (Signed)
Reports locking and popping sensation of the knees when driving She reports occasional knee pain Onset of symptoms 3 years ago, she notes worsening of symptoms during the fall and winter No recent injury or trauma Symptoms likely due to osteoarthritis of the knees Will get imaging studies today Encourage conservative management with Tylenol and increase physical activities

## 2022-07-05 DIAGNOSIS — E559 Vitamin D deficiency, unspecified: Secondary | ICD-10-CM | POA: Diagnosis not present

## 2022-07-05 DIAGNOSIS — Z794 Long term (current) use of insulin: Secondary | ICD-10-CM | POA: Diagnosis not present

## 2022-07-05 DIAGNOSIS — E785 Hyperlipidemia, unspecified: Secondary | ICD-10-CM | POA: Diagnosis not present

## 2022-07-05 DIAGNOSIS — E119 Type 2 diabetes mellitus without complications: Secondary | ICD-10-CM | POA: Diagnosis not present

## 2022-07-05 DIAGNOSIS — E0789 Other specified disorders of thyroid: Secondary | ICD-10-CM | POA: Diagnosis not present

## 2022-07-05 DIAGNOSIS — I10 Essential (primary) hypertension: Secondary | ICD-10-CM | POA: Diagnosis not present

## 2022-07-06 DIAGNOSIS — H52223 Regular astigmatism, bilateral: Secondary | ICD-10-CM | POA: Diagnosis not present

## 2022-07-06 LAB — CBC WITH DIFFERENTIAL/PLATELET
Basophils Absolute: 0.1 10*3/uL (ref 0.0–0.2)
Basos: 1 %
EOS (ABSOLUTE): 0.1 10*3/uL (ref 0.0–0.4)
Eos: 1 %
Hematocrit: 36 % (ref 34.0–46.6)
Hemoglobin: 11.9 g/dL (ref 11.1–15.9)
Immature Grans (Abs): 0 10*3/uL (ref 0.0–0.1)
Immature Granulocytes: 0 %
Lymphocytes Absolute: 2.8 10*3/uL (ref 0.7–3.1)
Lymphs: 39 %
MCH: 28.4 pg (ref 26.6–33.0)
MCHC: 33.1 g/dL (ref 31.5–35.7)
MCV: 86 fL (ref 79–97)
Monocytes Absolute: 0.5 10*3/uL (ref 0.1–0.9)
Monocytes: 7 %
Neutrophils Absolute: 3.8 10*3/uL (ref 1.4–7.0)
Neutrophils: 52 %
Platelets: 409 10*3/uL (ref 150–450)
RBC: 4.19 x10E6/uL (ref 3.77–5.28)
RDW: 19.4 % — ABNORMAL HIGH (ref 11.7–15.4)
WBC: 7.1 10*3/uL (ref 3.4–10.8)

## 2022-07-06 LAB — HEMOGLOBIN A1C
Est. average glucose Bld gHb Est-mCnc: 134 mg/dL
Hgb A1c MFr Bld: 6.3 % — ABNORMAL HIGH (ref 4.8–5.6)

## 2022-07-06 LAB — CMP14+EGFR
ALT: 8 IU/L (ref 0–32)
AST: 10 IU/L (ref 0–40)
Albumin/Globulin Ratio: 1.8 (ref 1.2–2.2)
Albumin: 4.3 g/dL (ref 3.9–4.9)
Alkaline Phosphatase: 50 IU/L (ref 44–121)
BUN/Creatinine Ratio: 16 (ref 9–23)
BUN: 11 mg/dL (ref 6–24)
Bilirubin Total: <0.2 mg/dL (ref 0.0–1.2)
CO2: 21 mmol/L (ref 20–29)
Calcium: 9.1 mg/dL (ref 8.7–10.2)
Chloride: 102 mmol/L (ref 96–106)
Creatinine, Ser: 0.7 mg/dL (ref 0.57–1.00)
Globulin, Total: 2.4 g/dL (ref 1.5–4.5)
Glucose: 100 mg/dL — ABNORMAL HIGH (ref 70–99)
Potassium: 4.1 mmol/L (ref 3.5–5.2)
Sodium: 139 mmol/L (ref 134–144)
Total Protein: 6.7 g/dL (ref 6.0–8.5)
eGFR: 106 mL/min/{1.73_m2} (ref >59)

## 2022-07-06 LAB — TSH+FREE T4
Free T4: 0.97 ng/dL (ref 0.82–1.77)
TSH: 1.85 u[IU]/mL (ref 0.450–4.500)

## 2022-07-06 LAB — LIPID PANEL
Chol/HDL Ratio: 4.7 ratio — ABNORMAL HIGH (ref 0.0–4.4)
Cholesterol, Total: 229 mg/dL — ABNORMAL HIGH (ref 100–199)
HDL: 49 mg/dL (ref >39)
LDL Chol Calc (NIH): 154 mg/dL — ABNORMAL HIGH (ref 0–99)
Triglycerides: 145 mg/dL (ref 0–149)
VLDL Cholesterol Cal: 26 mg/dL (ref 5–40)

## 2022-07-06 LAB — VITAMIN D 25 HYDROXY (VIT D DEFICIENCY, FRACTURES): Vit D, 25-Hydroxy: 30.3 ng/mL (ref 30.0–100.0)

## 2022-07-11 ENCOUNTER — Ambulatory Visit: Payer: 59 | Admitting: Family Medicine

## 2022-07-12 ENCOUNTER — Other Ambulatory Visit: Payer: Self-pay | Admitting: Family Medicine

## 2022-07-12 DIAGNOSIS — E785 Hyperlipidemia, unspecified: Secondary | ICD-10-CM

## 2022-07-12 MED ORDER — ROSUVASTATIN CALCIUM 20 MG PO TABS: 20.0000 mg | ORAL_TABLET | Freq: Every day | ORAL | 3 refills | Status: DC

## 2022-10-04 ENCOUNTER — Ambulatory Visit: Payer: Medicaid Other | Admitting: Family Medicine

## 2022-10-04 ENCOUNTER — Encounter: Payer: Self-pay | Admitting: Family Medicine

## 2022-10-04 VITALS — BP 115/76 | HR 64 | Ht 66.0 in | Wt 168.0 lb

## 2022-10-04 DIAGNOSIS — K581 Irritable bowel syndrome with constipation: Secondary | ICD-10-CM | POA: Diagnosis not present

## 2022-10-04 DIAGNOSIS — Z794 Long term (current) use of insulin: Secondary | ICD-10-CM | POA: Diagnosis not present

## 2022-10-04 DIAGNOSIS — E038 Other specified hypothyroidism: Secondary | ICD-10-CM

## 2022-10-04 DIAGNOSIS — E785 Hyperlipidemia, unspecified: Secondary | ICD-10-CM

## 2022-10-04 DIAGNOSIS — K219 Gastro-esophageal reflux disease without esophagitis: Secondary | ICD-10-CM

## 2022-10-04 DIAGNOSIS — E559 Vitamin D deficiency, unspecified: Secondary | ICD-10-CM

## 2022-10-04 DIAGNOSIS — E119 Type 2 diabetes mellitus without complications: Secondary | ICD-10-CM | POA: Diagnosis not present

## 2022-10-04 DIAGNOSIS — I10 Essential (primary) hypertension: Secondary | ICD-10-CM

## 2022-10-04 MED ORDER — ROSUVASTATIN CALCIUM 20 MG PO TABS: 20.0000 mg | ORAL_TABLET | Freq: Every day | ORAL | 3 refills | Status: DC

## 2022-10-04 MED ORDER — LINACLOTIDE 72 MCG PO CAPS: 72.0000 ug | ORAL_CAPSULE | Freq: Every day | ORAL | 2 refills | Status: DC

## 2022-10-04 MED ORDER — PANTOPRAZOLE SODIUM 20 MG PO TBEC: 20.0000 mg | DELAYED_RELEASE_TABLET | Freq: Every day | ORAL | 1 refills | Status: DC

## 2022-10-04 NOTE — Patient Instructions (Signed)
I appreciate the opportunity to provide care to you today!    Follow up:  1 months  Labs: please stop by the lab during the week to get your blood drawn (CBC, CMP, TSH, Lipid profile, HgA1c, Vit D)  Please pick up your prescriptions at the pharmacy   Lifestyle changes for GERD:  Avoiding foods that trigger symptoms - Some foods also cause relaxation of the lower esophageal sphincter, which can lead to acid reflux. Excessive caffeine, chocolate, alcohol, peppermint, and fatty foods may cause bothersome acid reflux in some people. If you notice that your symptoms are worse after you have certain foods or beverages (trigger foods), it's reasonable to limit or avoid these things.  Avoid certain foods and drinks, such as coffee, chocolate, onions, peppermint, spicy foods, carbonated beverages, citrus fruits, tomatoes, onions, Garlic, alcohol, Fatty foods (bacon, burgers, sausages, steak, fried foods, dairy food)    Recommended: High fiber foods, whole grain cereal, oatmeal, brown rice, root vegetables, non- citrus fruits, High protein foods, Health fats (avocados, olive oil, nuts and seeds)    Please continue to a heart-healthy diet and increase your physical activities. Try to exercise for at least five days a week.      It was a pleasure to see you and I look forward to continuing to work together on your health and well-being. Please do not hesitate to call the office if you need care or have questions about your care.   Have a wonderful day and week. With Gratitude, Gilmore Laroche MSN, FNP-BC

## 2022-10-04 NOTE — Progress Notes (Unsigned)
Established Patient Office Visit  Subjective:  Patient ID: Brittney Reyes, female    DOB: 12-Aug-1970  Age: 52 y.o. MRN: [id number]  CC:  Chief Complaint  Patient presents with   Chronic Care Management    Pt reports acid reflux, and constipation.     HPI Brittney Reyes is a 52 y.o. female with past medical history of hypertension, hyperlipidemia, type 2 diabetes, and IBS with constipation presents for f/u of  chronic medical conditions. For the details of today's visit, please refer to the assessment and plan.     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)   TONSILLECTOMY      Family History  Problem Relation Age of Onset   Diabetes Mother    Hypertension Mother    Hypertension Father    Migraines Daughter    Asthma Child    Colon cancer Neg Hx    Colon polyps Neg Hx     Social History   Socioeconomic History   Marital status: Married    Spouse name: Not on file   Number of children: Not on file   Years of education: Not on file   Highest education level: Associate degree: occupational, Scientist, product/process development, or vocational program  Occupational History   Not on file  Tobacco Use   Smoking status: Every Day    Packs/day: 1.00    Years: 25.00    Additional pack years: 0.00    Total pack years: 25.00    Types: Cigarettes   Smokeless tobacco: Never  Vaping Use   Vaping Use: Never used  Substance and Sexual Activity   Alcohol use: No   Drug use: No   Sexual activity: Not on file  Other Topics Concern   Not on file  Social History Narrative    Not on file   Social Determinants of Health   Financial Resource Strain: Medium Risk (10/01/2022)   Overall Financial Resource Strain (CARDIA)    Difficulty of Paying Living Expenses: Somewhat hard  Food Insecurity: No Food Insecurity (10/01/2022)   Hunger Vital Sign    Worried About Running Out of Food in the Last Year: Never true    Ran Out of Food in the Last Year: Never true  Transportation Needs: No Transportation Needs (10/01/2022)   PRAPARE - Administrator, Civil Service (Medical): No    Lack of Transportation (Non-Medical): No  Physical Activity: Unknown (10/01/2022)   Exercise Vital Sign    Days of Exercise per Week: 1 day    Minutes of Exercise per Session: Not on file  Stress: Not on file  Social Connections: Socially Integrated (10/01/2022)   Social Connection and Isolation Panel [NHANES]    Frequency of Communication with Friends and Family: Twice a week    Frequency of Social Gatherings with Friends and Family: Once a week    Attends Religious Services: More than 4 times per year    Active Member of [employer]  or Organizations: Yes    Attends Engineer, structural: More than 4 times per year    Marital Status: Married  Catering manager Violence: Not on file    Outpatient Medications Prior to Visit  Medication Sig Dispense Refill   acetaminophen (TYLENOL) 500 MG tablet Take 1,000 mg by mouth daily as needed for headache.     capsaicin (ZOSTRIX) 0.025 % cream Apply topically 2 (two) times daily. 60 g 0   metoprolol succinate (TOPROL-XL) 25 MG 24 hr tablet Take 1 tablet (25 mg total) by mouth daily. 90 tablet 1   triamterene-hydrochlorothiazide (MAXZIDE-25) 37.5-25 MG tablet Take 1 tablet by mouth daily. 90 tablet 2   rosuvastatin (CRESTOR) 20 MG tablet Take 1 tablet (20 mg total) by mouth daily. 90 tablet 3   No facility-administered medications prior to visit.    Allergies  Allergen Reactions   Duloxetine Hives   Fluoxetine Nausea And Vomiting    Lactose Intolerance (Gi) Cough, Diarrhea, Hives, Itching, Palpitations, Rash, Shortness Of Breath and Swelling   Milk (Cow) Hives   Gabapentin Other (See Comments)    Dizziness and slurring of words on this medication   Prozac [Fluoxetine Hcl] Nausea And Vomiting and Swelling   Wellbutrin [Bupropion] Hives   Gluten Meal Itching    ROS Review of Systems  Constitutional:  Negative for chills and fever.  Eyes:  Negative for visual disturbance.  Respiratory:  Negative for chest tightness and shortness of breath.   Gastrointestinal:  Positive for constipation.  Neurological:  Negative for dizziness and headaches.      Objective:    Physical Exam HENT:     Head: Normocephalic.     Mouth/Throat:     Mouth: Mucous membranes are moist.  Cardiovascular:     Rate and Rhythm: Normal rate.     Heart sounds: Normal heart sounds.  Pulmonary:     Effort: Pulmonary effort is normal.     Breath sounds: Normal breath sounds.  Neurological:     Mental Status: She is alert.     BP 115/76   Pulse 64   Ht  (1.676 m)   Wt 168 lb 0.6 oz (76.2 kg)   SpO2 95%   BMI 27.12 kg/m  Wt Readings from Last 3 Encounters:  10/04/22 168 lb 0.6 oz (76.2 kg)  07/04/22 158 lb (71.7 kg)  06/20/22 156 lb 3.2 oz (70.9 kg)    Lab Results  Component Value Date   TSH 1.850 07/05/2022   Lab Results  Component Value Date   WBC 7.1 07/05/2022   HGB 11.9 07/05/2022   HCT 36.0 07/05/2022   MCV 86 07/05/2022   PLT 409 07/05/2022   Lab Results  Component Value Date   NA 139 07/05/2022   K 4.1 07/05/2022   CO2 21 07/05/2022   GLUCOSE 100 (H) 07/05/2022   BUN 11 07/05/2022   CREATININE 0.70 07/05/2022   BILITOT <0.2 07/05/2022   ALKPHOS 50 07/05/2022   AST 10 07/05/2022   ALT 8 07/05/2022   PROT 6.7 07/05/2022   ALBUMIN 4.3 07/05/2022   CALCIUM 9.1 07/05/2022   EGFR 106 07/05/2022   Lab Results  Component Value Date   CHOL 229 (H) 07/05/2022   Lab Results  Component Value Date   HDL  49 07/05/2022   Lab Results  Component Value Date   LDLCALC 154 (H) 07/05/2022   Lab Results  Component Value Date   TRIG 145 07/05/2022   Lab Results  Component Value Date   CHOLHDL 4.7 (H) 07/05/2022   Lab Results  Component Value Date   HGBA1C 6.3 (H) 07/05/2022      Assessment & Plan:  Primary hypertension Assessment & Plan: Controlled BP is stable on metoprolol 25 mg daily, and Maxzide-25 mg daily She denies headaches, dizziness and chest pain Patient is asymptomatic today Will assess CBC and CMP BP Readings from Last 3 Encounters:  10/04/22 115/76  07/04/22 134/78  06/20/22 122/76      Gastroesophageal reflux disease without esophagitis Assessment & Plan: Complains of symptoms of heartburn and  indigestion Admits to eating spicy and acidic foods Encouraged GERD diet Will treat with Protonix 20 mg daily  Orders: -     Pantoprazole Sodium; Take 1 tablet (20 mg total) by mouth daily.  Dispense: 60 tablet; Refill: 1  Type 2 diabetes mellitus without complication, with long-term current use of insulin Assessment & Plan: Lab Results  Component Value Date   HGBA1C 6.3 (H) 07/05/2022  She reports stopping metformin because of the side effects of diarrhea She denies the 3ps of diabetes. Pending labs  Orders: -     Hemoglobin A1c  Irritable bowel syndrome with constipation Assessment & Plan: History of IBS with constipation Reports minimal relief of symptoms with over-the-counter regiment Will treat today with Linzess 72 mg daily  Orders: -     linaCLOtide; Take 1 capsule (72 mcg total) by mouth daily before breakfast.  Dispense: 60 capsule; Refill: 2  Dyslipidemia, goal LDL below 100 Assessment & Plan: She takes rosuvastatin 20 mg daily Reports only be taking rosuvastatin 10 mg daily Encouraged to take 20 mg of rosuvastatin daily Prescription sent to the pharmacy She denies muscle aches and pain Will assess lipid panel today Lab Results   Component Value Date   CHOL 229 (H) 07/05/2022   HDL 49 07/05/2022   LDLCALC 154 (H) 07/05/2022   TRIG 145 07/05/2022   CHOLHDL 4.7 (H) 07/05/2022     Orders: -     Rosuvastatin Calcium; Take 1 tablet (20 mg total) by mouth daily.  Dispense: 90 tablet; Refill: 3 -     Lipid panel -     CMP14+EGFR -     CBC with Differential/Platelet  Other specified hypothyroidism -     TSH + free T4  Vitamin D deficiency -     VITAMIN D 25 Hydroxy (Vit-D Deficiency, Fractures)    Follow-up: Return in about 1 month (around 11/03/2022) for GERD.   Gilmore Laroche, FNP

## 2022-10-05 DIAGNOSIS — K589 Irritable bowel syndrome without diarrhea: Secondary | ICD-10-CM | POA: Insufficient documentation

## 2022-10-05 DIAGNOSIS — K219 Gastro-esophageal reflux disease without esophagitis: Secondary | ICD-10-CM | POA: Insufficient documentation

## 2022-10-05 NOTE — Assessment & Plan Note (Signed)
Controlled BP is stable on metoprolol 25 mg daily, and Maxzide-25 mg daily She denies headaches, dizziness and chest pain Patient is asymptomatic today Will assess CBC and CMP BP Readings from Last 3 Encounters:  10/04/22 115/76  07/04/22 134/78  06/20/22 122/76

## 2022-10-05 NOTE — Assessment & Plan Note (Signed)
Complains of symptoms of heartburn and  indigestion Admits to eating spicy and acidic foods Encouraged GERD diet Will treat with Protonix 20 mg daily

## 2022-10-05 NOTE — Assessment & Plan Note (Signed)
Lab Results  Component Value Date   HGBA1C 6.3 (H) 07/05/2022  She reports stopping metformin because of the side effects of diarrhea She denies the 3ps of diabetes. Pending labs

## 2022-10-05 NOTE — Assessment & Plan Note (Signed)
She takes rosuvastatin 20 mg daily Reports only be taking rosuvastatin 10 mg daily Encouraged to take 20 mg of rosuvastatin daily Prescription sent to the pharmacy She denies muscle aches and pain Will assess lipid panel today Lab Results  Component Value Date   CHOL 229 (H) 07/05/2022   HDL 49 07/05/2022   LDLCALC 154 (H) 07/05/2022   TRIG 145 07/05/2022   CHOLHDL 4.7 (H) 07/05/2022

## 2022-10-05 NOTE — Assessment & Plan Note (Signed)
History of IBS with constipation Reports minimal relief of symptoms with over-the-counter regiment Will treat today with Linzess 72 mg daily

## 2022-10-06 DIAGNOSIS — E119 Type 2 diabetes mellitus without complications: Secondary | ICD-10-CM | POA: Diagnosis not present

## 2022-10-06 DIAGNOSIS — Z794 Long term (current) use of insulin: Secondary | ICD-10-CM | POA: Diagnosis not present

## 2022-10-06 DIAGNOSIS — E785 Hyperlipidemia, unspecified: Secondary | ICD-10-CM | POA: Diagnosis not present

## 2022-10-06 DIAGNOSIS — E038 Other specified hypothyroidism: Secondary | ICD-10-CM | POA: Diagnosis not present

## 2022-10-06 DIAGNOSIS — E559 Vitamin D deficiency, unspecified: Secondary | ICD-10-CM | POA: Diagnosis not present

## 2022-10-07 LAB — CMP14+EGFR
ALT: 8 IU/L (ref 0–32)
AST: 9 IU/L (ref 0–40)
Albumin/Globulin Ratio: 1.8 (ref 1.2–2.2)
Albumin: 4.2 g/dL (ref 3.9–4.9)
Alkaline Phosphatase: 60 IU/L (ref 44–121)
BUN/Creatinine Ratio: 14 (ref 9–23)
BUN: 10 mg/dL (ref 6–24)
Bilirubin Total: <0.2 mg/dL (ref 0.0–1.2)
CO2: 22 mmol/L (ref 20–29)
Calcium: 9.1 mg/dL (ref 8.7–10.2)
Chloride: 98 mmol/L (ref 96–106)
Creatinine, Ser: 0.73 mg/dL (ref 0.57–1.00)
Globulin, Total: 2.4 g/dL (ref 1.5–4.5)
Glucose: 102 mg/dL — ABNORMAL HIGH (ref 70–99)
Potassium: 4.3 mmol/L (ref 3.5–5.2)
Sodium: 136 mmol/L (ref 134–144)
Total Protein: 6.6 g/dL (ref 6.0–8.5)
eGFR: 101 mL/min/{1.73_m2} (ref >59)

## 2022-10-07 LAB — CBC WITH DIFFERENTIAL/PLATELET
Basophils Absolute: 0 10*3/uL (ref 0.0–0.2)
Basos: 1 %
EOS (ABSOLUTE): 0.1 10*3/uL (ref 0.0–0.4)
Eos: 1 %
Hematocrit: 33.9 % — ABNORMAL LOW (ref 34.0–46.6)
Hemoglobin: 11.2 g/dL (ref 11.1–15.9)
Immature Grans (Abs): 0 10*3/uL (ref 0.0–0.1)
Immature Granulocytes: 0 %
Lymphocytes Absolute: 2.1 10*3/uL (ref 0.7–3.1)
Lymphs: 30 %
MCH: 29.6 pg (ref 26.6–33.0)
MCHC: 33 g/dL (ref 31.5–35.7)
MCV: 89 fL (ref 79–97)
Monocytes Absolute: 0.5 10*3/uL (ref 0.1–0.9)
Monocytes: 7 %
Neutrophils Absolute: 4.3 10*3/uL (ref 1.4–7.0)
Neutrophils: 61 %
Platelets: 380 10*3/uL (ref 150–450)
RBC: 3.79 x10E6/uL (ref 3.77–5.28)
RDW: 15.3 % (ref 11.7–15.4)
WBC: 7 10*3/uL (ref 3.4–10.8)

## 2022-10-07 LAB — VITAMIN D 25 HYDROXY (VIT D DEFICIENCY, FRACTURES): Vit D, 25-Hydroxy: 31.6 ng/mL (ref 30.0–100.0)

## 2022-10-07 LAB — TSH+FREE T4
Free T4: 0.91 ng/dL (ref 0.82–1.77)
TSH: 2.98 u[IU]/mL (ref 0.450–4.500)

## 2022-10-07 LAB — LIPID PANEL
Chol/HDL Ratio: 2.6 ratio (ref 0.0–4.4)
Cholesterol, Total: 174 mg/dL (ref 100–199)
HDL: 68 mg/dL (ref >39)
LDL Chol Calc (NIH): 82 mg/dL (ref 0–99)
Triglycerides: 139 mg/dL (ref 0–149)
VLDL Cholesterol Cal: 24 mg/dL (ref 5–40)

## 2022-10-07 LAB — HEMOGLOBIN A1C
Est. average glucose Bld gHb Est-mCnc: 140 mg/dL
Hgb A1c MFr Bld: 6.5 % — ABNORMAL HIGH (ref 4.8–5.6)

## 2022-10-10 ENCOUNTER — Other Ambulatory Visit: Payer: Self-pay | Admitting: Family Medicine

## 2022-10-10 DIAGNOSIS — E119 Type 2 diabetes mellitus without complications: Secondary | ICD-10-CM

## 2022-10-10 MED ORDER — METFORMIN HCL 500 MG PO TABS
500.0000 mg | ORAL_TABLET | Freq: Every day | ORAL | 3 refills | Status: AC
Start: 2022-10-10 — End: ?

## 2022-11-07 ENCOUNTER — Encounter: Payer: Self-pay | Admitting: Family Medicine

## 2022-11-10 ENCOUNTER — Ambulatory Visit: Payer: Medicaid Other | Admitting: Family Medicine

## 2022-11-23 ENCOUNTER — Encounter: Payer: Self-pay | Admitting: Gastroenterology

## 2022-11-28 ENCOUNTER — Encounter: Payer: Self-pay | Admitting: Family Medicine

## 2022-11-28 NOTE — Telephone Encounter (Signed)
Per patient appointment was canceled once nurse and provider gets her a medication that her insurance would cover, let her know when she needs to reschedule.

## 2022-11-29 ENCOUNTER — Ambulatory Visit: Payer: 59 | Admitting: Family Medicine

## 2022-11-30 ENCOUNTER — Other Ambulatory Visit: Payer: Self-pay | Admitting: Family Medicine

## 2022-12-18 ENCOUNTER — Encounter: Payer: Self-pay | Admitting: Family Medicine

## 2022-12-18 ENCOUNTER — Ambulatory Visit (INDEPENDENT_AMBULATORY_CARE_PROVIDER_SITE_OTHER): Payer: 59 | Admitting: Family Medicine

## 2022-12-18 VITALS — BP 119/76 | HR 63 | Ht 66.0 in | Wt 176.0 lb

## 2022-12-18 DIAGNOSIS — Z23 Encounter for immunization: Secondary | ICD-10-CM | POA: Diagnosis not present

## 2022-12-18 DIAGNOSIS — R7301 Impaired fasting glucose: Secondary | ICD-10-CM | POA: Diagnosis not present

## 2022-12-18 DIAGNOSIS — E038 Other specified hypothyroidism: Secondary | ICD-10-CM

## 2022-12-18 DIAGNOSIS — E559 Vitamin D deficiency, unspecified: Secondary | ICD-10-CM | POA: Diagnosis not present

## 2022-12-18 DIAGNOSIS — G911 Obstructive hydrocephalus: Secondary | ICD-10-CM | POA: Diagnosis not present

## 2022-12-18 DIAGNOSIS — Z720 Tobacco use: Secondary | ICD-10-CM | POA: Diagnosis not present

## 2022-12-18 DIAGNOSIS — Z122 Encounter for screening for malignant neoplasm of respiratory organs: Secondary | ICD-10-CM

## 2022-12-18 DIAGNOSIS — Z1231 Encounter for screening mammogram for malignant neoplasm of breast: Secondary | ICD-10-CM

## 2022-12-18 DIAGNOSIS — M62830 Muscle spasm of back: Secondary | ICD-10-CM | POA: Diagnosis not present

## 2022-12-18 DIAGNOSIS — I10 Essential (primary) hypertension: Secondary | ICD-10-CM

## 2022-12-18 DIAGNOSIS — R0609 Other forms of dyspnea: Secondary | ICD-10-CM | POA: Diagnosis not present

## 2022-12-18 DIAGNOSIS — E7849 Other hyperlipidemia: Secondary | ICD-10-CM

## 2022-12-18 DIAGNOSIS — E663 Overweight: Secondary | ICD-10-CM | POA: Diagnosis not present

## 2022-12-18 MED ORDER — CYCLOBENZAPRINE HCL 5 MG PO TABS
5.0000 mg | ORAL_TABLET | Freq: Every day | ORAL | 1 refills | Status: DC
Start: 2022-12-18 — End: 2023-08-15

## 2022-12-18 NOTE — Progress Notes (Unsigned)
Established Patient Office Visit  Subjective:  Patient ID: Brittney Reyes, female    DOB: 1970-08-07  Age: 52 y.o. MRN: [id number]  CC:  Chief Complaint  Patient presents with   Chronic Care Management    Follow up, pt reports back pain and sob.   Obesity    Pt would like to discuss weight.    HPI Brittney Reyes is a 52 y.o. female with past medical history of type 2 diabetes, GERD, hypertension and tobacco use presents for f/u of  chronic medical conditions. For the details of today's visit, please refer to the assessment and plan.     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)   TONSILLECTOMY      Family History  Problem Relation Age of Onset   Diabetes Mother    Hypertension Mother    Hypertension Father    Migraines Daughter    Asthma Child    Colon cancer Neg Hx    Colon polyps Neg Hx     Social History   Socioeconomic History   Marital status: Married    Spouse name: Not on file   Number of children: Not on file   Years of education: Not on file   Highest education level: Associate degree: occupational, Scientist, product/process development, or vocational program  Occupational History   Not on file  Tobacco Use   Smoking status: Former    Packs/day: 1.00    Years: 25.00    Additional pack years: 0.00    Total pack years: 25.00    Types: Cigarettes    Quit date: 10/09/2022    Years since quitting: 0.1   Smokeless tobacco: Never   Tobacco comments:    Quit   Vaping Use   Vaping Use: Never used  Substance and Sexual Activity   Alcohol use: No    Drug use: No   Sexual activity: Not on file  Other Topics Concern   Not on file  Social History Narrative   Not on file   Social Determinants of Health   Financial Resource Strain: Medium Risk (10/01/2022)   Overall Financial Resource Strain (CARDIA)    Difficulty of Paying Living Expenses: Somewhat hard  Food Insecurity: No Food Insecurity (10/01/2022)   Hunger Vital Sign    Worried About Running Out of Food in the Last Year: Never true    Ran Out of Food in the Last Year: Never true  Transportation Needs: No Transportation Needs (10/01/2022)   PRAPARE - Administrator, Civil Service (Medical): No    Lack of Transportation (Non-Medical): No  Physical Activity: Unknown (10/01/2022)   Exercise Vital Sign    Days of Exercise per Week: 1 day    Minutes of Exercise per Session: Not on file  Stress: Not on file  Social Connections: Socially Integrated (10/01/2022)   Social Connection and Isolation Panel [NHANES]    Frequency of Communication with Friends and Family: Twice a  week    Frequency of Social Gatherings with Friends and Family: Once a week    Attends Religious Services: More than 4 times per year    Active Member of [employer] or Organizations: Yes    Attends Engineer, structural: More than 4 times per year    Marital Status: Married  Catering manager Violence: Not on file    Outpatient Medications Prior to Visit  Medication Sig Dispense Refill   acetaminophen (TYLENOL) 500 MG tablet Take 1,000 mg by mouth daily as needed for headache.     metFORMIN (GLUCOPHAGE) 500 MG tablet Take 1 tablet (500 mg total) by mouth daily. 180 tablet 3   metoprolol succinate (TOPROL-XL) 25 MG 24 hr tablet Take 1 tablet (25 mg total) by mouth daily. 90 tablet 1   pantoprazole (PROTONIX) 20 MG tablet Take 1 tablet (20 mg total) by mouth daily. 60 tablet 1   rosuvastatin (CRESTOR) 20 MG tablet Take 1 tablet (20 mg total) by mouth daily. 90 tablet 3    triamterene-hydrochlorothiazide (MAXZIDE-25) 37.5-25 MG tablet Take 1 tablet by mouth daily. 90 tablet 2   capsaicin (ZOSTRIX) 0.025 % cream Apply topically 2 (two) times daily. 60 g 0   linaclotide (LINZESS) 72 MCG capsule Take 1 capsule (72 mcg total) by mouth daily before breakfast. 60 capsule 2   No facility-administered medications prior to visit.    Allergies  Allergen Reactions   Duloxetine Hives   Fluoxetine Nausea And Vomiting   Lactose Intolerance (Gi) Cough, Diarrhea, Hives, Itching, Palpitations, Rash, Shortness Of Breath and Swelling   Milk (Cow) Hives   Gabapentin Other (See Comments)    Dizziness and slurring of words on this medication   Prozac [Fluoxetine Hcl] Nausea And Vomiting and Swelling   Wellbutrin [Bupropion] Hives   Gluten Meal Itching    ROS Review of Systems  Constitutional:  Positive for fatigue (with exertion). Negative for chills and fever.  Eyes:  Negative for visual disturbance.  Respiratory:  Positive for shortness of breath (with exertion). Negative for cough, choking, chest tightness and wheezing.   Neurological:  Negative for dizziness and headaches.      Objective:    Physical Exam HENT:     Head: Normocephalic.     Mouth/Throat:     Mouth: Mucous membranes are moist.  Cardiovascular:     Rate and Rhythm: Normal rate.     Heart sounds: Normal heart sounds.  Pulmonary:     Effort: Pulmonary effort is normal.     Breath sounds: Normal breath sounds.  Musculoskeletal:     Thoracic back: Spasms present.  Neurological:     Mental Status: She is alert.     BP 119/76   Pulse 63   Ht 5\' 6"  (1.676 m)   Wt 176 lb (79.8 kg)   SpO2 95%   BMI 28.41 kg/m  Wt Readings from Last 3 Encounters:  12/18/22 176 lb (79.8 kg)  10/04/22 168 lb 0.6 oz (76.2 kg)  07/04/22 158 lb (71.7 kg)    Lab Results  Component Value Date   TSH 2.980 10/06/2022   Lab Results  Component Value Date   WBC 7.0 10/06/2022   HGB 11.2 10/06/2022   HCT 33.9  (L) 10/06/2022   MCV 89 10/06/2022   PLT 380 10/06/2022   Lab Results  Component Value Date   NA 136 10/06/2022   K 4.3 10/06/2022   CO2 22 10/06/2022   GLUCOSE 102 (H) 10/06/2022   BUN  10 10/06/2022   CREATININE 0.73 10/06/2022   BILITOT <0.2 10/06/2022   ALKPHOS 60 10/06/2022   AST 9 10/06/2022   ALT 8 10/06/2022   PROT 6.6 10/06/2022   ALBUMIN 4.2 10/06/2022   CALCIUM 9.1 10/06/2022   EGFR 101 10/06/2022   Lab Results  Component Value Date   CHOL 174 10/06/2022   Lab Results  Component Value Date   HDL 68 10/06/2022   Lab Results  Component Value Date   LDLCALC 82 10/06/2022   Lab Results  Component Value Date   TRIG 139 10/06/2022   Lab Results  Component Value Date   CHOLHDL 2.6 10/06/2022   Lab Results  Component Value Date   HGBA1C 6.5 (H) 10/06/2022      Assessment & Plan:  Primary hypertension Assessment & Plan: Controlled BP is stable on metoprolol 25 mg daily, and Maxzide-25 mg daily She denies headaches, dizziness and chest pain Patient is asymptomatic today Will assess CBC and CMP BP Readings from Last 3 Encounters:  12/18/22 119/76  10/04/22 115/76  07/04/22 134/78      Tobacco use Assessment & Plan: Smoking history since the age of 87, she reports smoking 5 pack of cigarettes daily The patient has quit smoking Encourage the patient on smoking cessation Referral to pulmonary for annual lung cancer screening   Overweight (BMI 25.0-29.9)  Obstructive hydrocephalus (HCC) Assessment & Plan: The patient was following up with [facility] cardiology in [place] for palpitation and SVT EKG in the clinic shows normal sinus rhythm She complains of increased dyspnea with exertion in the last few weeks She also complains of increased fatigue No reports of chest tightness, palpitation, and chest pain She does note chronic reports of orthopnea She was started on metoprolol, which she currently takes for palpitation Referral placed to  cardiology for follow-up and cardiac evaluation  Orders: -     Ambulatory referral to Cardiology  DOE (dyspnea on exertion) -     EKG 12-Lead  Back muscle spasm Assessment & Plan: Complains of tightness and pulling sensation in her thoracic spine Will treat today with Flexeril 5 mg nightly Discussed the side effects of Flexeril Encouraged supportive treatment with heat application to lower back, avoiding aggravating activities, rest, and over-the-counter analgesics as needed for pain control   Orders: -     Cyclobenzaprine HCl; Take 1 tablet (5 mg total) by mouth at bedtime.  Dispense: 30 tablet; Refill: 1  Screening for lung cancer -     Ambulatory referral to Pulmonology  Immunization due -     Varicella-zoster vaccine IM  IFG (impaired fasting glucose) -     Hemoglobin A1c  Vitamin D deficiency -     VITAMIN D 25 Hydroxy (Vit-D Deficiency, Fractures)  Other specified hypothyroidism -     TSH + free T4  Other hyperlipidemia -     Lipid panel -     CMP14+EGFR -     CBC with Differential/Platelet  Breast cancer screening by mammogram -     3D Screening Mammogram, Left and Right  Note: This chart has been completed using Dragon Medical Dictation software, and while attempts have been made to ensure accuracy, certain words and phrases may not be transcribed as intended.    Follow-up: Return in about 3 months (around 03/20/2023).   Gilmore Laroche, FNP

## 2022-12-18 NOTE — Patient Instructions (Addendum)
I appreciate the opportunity to provide care to you today!    Follow up:  3 months  Labs: please stop by the lab today to get your blood drawn (CBC, CMP, TSH, Lipid profile, HgA1c, Vit D)   Your  EKG shows normal sinus rhythm   Scheduled  mammogram  SOB with exertion Activity modification -Recommend pacing yourself doing activities, break tasks into smaller manageable steps and take frequent rest breaks Avoid sudden, intense exertion and build up activity level gradually I recommend engaging in regular, moderate intensity exercises as tolerated. Activities like walking, swimming, or cycling can improve cardiovascular and respiratory fitness I also recommend maintaining a balanced diet rich in fruits, vegetables, lean proteins, whole grains to support overall health and weight management I recommend a heart healthy diet with increased physical activity to achieve and maintain a healthy weight to reduce the burden on the heart and lungs Also recommend staying well-hydrated to help maintain optimal body function  Weight loss tips Eat three meals per day at times discussed. Cut out all diet bevergages and drink only water Eat whole food plant based meals Cut out junk food, fast food and processed foods Exercise 150 minutes a week Lose 1-2 lbs per week. Keep a food journal Choose foods that grow in a garden or in a fruit orchard and protein of animals with fins or feathers.  Lifestyle Medicine  - Whole Food, Plant Predominant Nutrition is highly recommended: Eat Plenty of vegetables, Mushrooms, fruits, Legumes, Whole Grains, Nuts, seeds in lieu of processed meats, processed snacks/pastries red meat, poultry, eggs.  -It is better to avoid simple carbohydrates including: Cakes, Sweet Desserts, Ice Cream, Soda (diet and regular), Sweet Tea, Candies, Chips, Cookies, Store Bought Juices, Alcohol in Excess of  1-2 drinks a day, Lemonade,  Artificial Sweeteners, Doughnuts, Coffee Creamers,  "Sugar-free" Products, etc, etc.  This is not a complete list..... Exercise: If you are able: 30 -60 minutes a day ,4 days a week, or 150 minutes a week.  The longer the better.  Combine stretch, strength, and aerobic activities.  If you were told in the past that you have high risk for cardiovascular diseases, you may seek evaluation by your heart doctor prior to initiating moderate to intense exercise programs.    Referrals today- pulmonary, cardiology  Attached with your AVS, you will find valuable resources for self-education. I highly recommend dedicating some time to thoroughly examine them.   Please continue to a heart-healthy diet and increase your physical activities. Try to exercise for at least five days a week.    It was a pleasure to see you and I look forward to continuing to work together on your health and well-being. Please do not hesitate to call the office if you need care or have questions about your care.  In case of emergency, please visit the Emergency Department for urgent care, or contact our clinic at [phone] to schedule an appointment. We're here to help you!   Have a wonderful day and week. With Gratitude, Gilmore Laroche MSN, FNP-BC

## 2022-12-19 DIAGNOSIS — E559 Vitamin D deficiency, unspecified: Secondary | ICD-10-CM | POA: Diagnosis not present

## 2022-12-19 DIAGNOSIS — E7849 Other hyperlipidemia: Secondary | ICD-10-CM | POA: Diagnosis not present

## 2022-12-19 DIAGNOSIS — E663 Overweight: Secondary | ICD-10-CM | POA: Insufficient documentation

## 2022-12-19 DIAGNOSIS — E038 Other specified hypothyroidism: Secondary | ICD-10-CM | POA: Diagnosis not present

## 2022-12-19 DIAGNOSIS — R7301 Impaired fasting glucose: Secondary | ICD-10-CM | POA: Diagnosis not present

## 2022-12-19 DIAGNOSIS — M62830 Muscle spasm of back: Secondary | ICD-10-CM | POA: Insufficient documentation

## 2022-12-19 NOTE — Assessment & Plan Note (Signed)
Controlled BP is stable on metoprolol 25 mg daily, and Maxzide-25 mg daily She denies headaches, dizziness and chest pain Patient is asymptomatic today Will assess CBC and CMP BP Readings from Last 3 Encounters:  12/18/22 119/76  10/04/22 115/76  07/04/22 134/78

## 2022-12-19 NOTE — Assessment & Plan Note (Addendum)
Smoking history since the age of 70, she reports smoking 5 pack of cigarettes daily The patient has quit smoking Encourage the patient on smoking cessation Referral to pulmonary for annual lung cancer screening

## 2022-12-19 NOTE — Assessment & Plan Note (Addendum)
Complains of tightness and pulling sensation in her thoracic spine Will treat today with Flexeril 5 mg nightly Discussed the side effects of Flexeril Encouraged supportive treatment with heat application to lower back, avoiding aggravating activities, rest, and over-the-counter analgesics as needed for pain control

## 2022-12-19 NOTE — Assessment & Plan Note (Signed)
The patient was following up with [facility] cardiology in [place] for palpitation and SVT EKG in the clinic shows normal sinus rhythm She complains of increased dyspnea with exertion in the last few weeks She also complains of increased fatigue No reports of chest tightness, palpitation, and chest pain She does note chronic reports of orthopnea She was started on metoprolol, which she currently takes for palpitation Referral placed to cardiology for follow-up and cardiac evaluation

## 2022-12-20 LAB — CBC WITH DIFFERENTIAL/PLATELET
Basophils Absolute: 0 10*3/uL (ref 0.0–0.2)
Basos: 1 %
EOS (ABSOLUTE): 0.1 10*3/uL (ref 0.0–0.4)
Eos: 1 %
Hematocrit: 34.8 % (ref 34.0–46.6)
Hemoglobin: 11.6 g/dL (ref 11.1–15.9)
Immature Grans (Abs): 0 10*3/uL (ref 0.0–0.1)
Immature Granulocytes: 0 %
Lymphocytes Absolute: 1.4 10*3/uL (ref 0.7–3.1)
Lymphs: 18 %
MCH: 27.4 pg (ref 26.6–33.0)
MCHC: 33.3 g/dL (ref 31.5–35.7)
MCV: 82 fL (ref 79–97)
Monocytes Absolute: 0.6 10*3/uL (ref 0.1–0.9)
Monocytes: 8 %
Neutrophils Absolute: 5.8 10*3/uL (ref 1.4–7.0)
Neutrophils: 72 %
Platelets: 375 10*3/uL (ref 150–450)
RBC: 4.23 x10E6/uL (ref 3.77–5.28)
RDW: 14.1 % (ref 11.7–15.4)
WBC: 8 10*3/uL (ref 3.4–10.8)

## 2022-12-20 LAB — CMP14+EGFR
ALT: 16 IU/L (ref 0–32)
AST: 15 IU/L (ref 0–40)
Albumin: 4.2 g/dL (ref 3.9–4.9)
Alkaline Phosphatase: 64 IU/L (ref 44–121)
BUN/Creatinine Ratio: 9 (ref 9–23)
BUN: 6 mg/dL (ref 6–24)
Bilirubin Total: 0.2 mg/dL (ref 0.0–1.2)
CO2: 23 mmol/L (ref 20–29)
Calcium: 9.6 mg/dL (ref 8.7–10.2)
Chloride: 97 mmol/L (ref 96–106)
Creatinine, Ser: 0.69 mg/dL (ref 0.57–1.00)
Globulin, Total: 2.2 g/dL (ref 1.5–4.5)
Glucose: 111 mg/dL — ABNORMAL HIGH (ref 70–99)
Potassium: 3.6 mmol/L (ref 3.5–5.2)
Sodium: 136 mmol/L (ref 134–144)
Total Protein: 6.4 g/dL (ref 6.0–8.5)
eGFR: 106 mL/min/{1.73_m2} (ref >59)

## 2022-12-20 LAB — LIPID PANEL
Chol/HDL Ratio: 1.8 ratio (ref 0.0–4.4)
Cholesterol, Total: 138 mg/dL (ref 100–199)
HDL: 78 mg/dL (ref >39)
LDL Chol Calc (NIH): 40 mg/dL (ref 0–99)
Triglycerides: 111 mg/dL (ref 0–149)
VLDL Cholesterol Cal: 20 mg/dL (ref 5–40)

## 2022-12-20 LAB — TSH+FREE T4
Free T4: 1.05 ng/dL (ref 0.82–1.77)
TSH: 1.54 u[IU]/mL (ref 0.450–4.500)

## 2022-12-20 LAB — HEMOGLOBIN A1C
Est. average glucose Bld gHb Est-mCnc: 143 mg/dL
Hgb A1c MFr Bld: 6.6 % — ABNORMAL HIGH (ref 4.8–5.6)

## 2022-12-20 LAB — VITAMIN D 25 HYDROXY (VIT D DEFICIENCY, FRACTURES): Vit D, 25-Hydroxy: 33 ng/mL (ref 30.0–100.0)

## 2022-12-21 ENCOUNTER — Other Ambulatory Visit: Payer: Self-pay | Admitting: Family Medicine

## 2022-12-21 DIAGNOSIS — Z794 Long term (current) use of insulin: Secondary | ICD-10-CM

## 2022-12-21 MED ORDER — METFORMIN HCL 500 MG PO TABS
1000.0000 mg | ORAL_TABLET | Freq: Every day | ORAL | 3 refills | Status: DC
Start: 2022-12-21 — End: 2023-09-20

## 2022-12-21 MED ORDER — OZEMPIC (0.25 OR 0.5 MG/DOSE) 2 MG/3ML ~~LOC~~ SOPN
0.2500 mg | PEN_INJECTOR | SUBCUTANEOUS | 0 refills | Status: DC
Start: 2022-12-21 — End: 2022-12-22

## 2022-12-22 ENCOUNTER — Other Ambulatory Visit: Payer: Self-pay | Admitting: Family Medicine

## 2022-12-22 DIAGNOSIS — E119 Type 2 diabetes mellitus without complications: Secondary | ICD-10-CM

## 2022-12-22 DIAGNOSIS — Z794 Long term (current) use of insulin: Secondary | ICD-10-CM

## 2022-12-25 ENCOUNTER — Other Ambulatory Visit: Payer: Self-pay | Admitting: Family Medicine

## 2022-12-25 DIAGNOSIS — Z794 Long term (current) use of insulin: Secondary | ICD-10-CM

## 2023-01-01 NOTE — Progress Notes (Deleted)
GI Office Note    Referring Provider: Gilmore Laroche, FNP Primary Care Physician:  Gilmore Laroche, FNP Primary Gastroenterologist: Hennie Duos. Marletta Lor, DO  Date:  01/01/2023  ID:  Brittney Reyes, DOB Oct 12, 1970, MRN [id number]   Chief Complaint   No chief complaint on file.  History of Present Illness  Brittney Reyes is a 52 y.o. female with a history of hydrocephalus, anxiety, HTN, and weight loss presenting today for follow up.   Initial consultation 02/27/22. Reported diarrhea that began after first dose of metformin.  Typically suffers from constipation taking daily fiber and occasional accidents.  History of cholecystectomy 5-10 years prior but denied looser stools after this.  Also stated a 30 pound weight loss over 2 years without trying.  Stated 10 of those last pounds have been within the last month since starting metformin.  Weight was noted to be 171 pounds at PCP office in September 2021.  Weight in the office this day was 157 pounds.  Prior TCS records requested.  Not available with [facility] gastroenterology or office from [place].  Scheduled for colonoscopy.  No alarm symptoms present other than weight loss.   Labs 03/08/2022: Hemoglobin 10.6, MCV 84, platelets 498, normal LFTs and renal function. TSH 1.23, normal lipid panel, A1c 6.3.  Vitamin D 36.4.   Colonoscopy 03/21/2022: -Nonbleeding internal hemorrhoids -Exam otherwise normal -Repeat colonoscopy in 10 years -Office visit in 3 months  Last office visit 06/20/22.  Diarrhea subsided after stopping metformin.  Still complains of lack of appetite, only eating 1 meal per day.  Noted to be continuously stressed at her job which is when her weight loss started.  Has always eaten 1 meal a day since high school.  Reports some stiffness in the morning if she eats too soon.  Weight was holding steady in the mid 150s.  Usually weight was around 170-180 pounds.  She is advised to continue to monitor her weight weekly and to notify  the office if any significant drops of 5-15% within a couple months.  Also counseled on extra protein supplementation.  Today:  Lack of appetite -   Weight loss -   Wt Readings from Last 3 Encounters:  12/18/22 176 lb (79.8 kg)  10/04/22 168 lb 0.6 oz (76.2 kg)  07/04/22 158 lb (71.7 kg)   03/21/22 157 lb 0.6 oz (71.2 kg)  03/15/22 157 lb 0.6 oz (71.2 kg)  September 2021, reports weight 171 pounds    Current Outpatient Medications  Medication Sig Dispense Refill   acetaminophen (TYLENOL) 500 MG tablet Take 1,000 mg by mouth daily as needed for headache.     capsaicin (ZOSTRIX) 0.025 % cream Apply topically 2 (two) times daily. 60 g 0   cyclobenzaprine (FLEXERIL) 5 MG tablet Take 1 tablet (5 mg total) by mouth at bedtime. 30 tablet 1   linaclotide (LINZESS) 72 MCG capsule Take 1 capsule (72 mcg total) by mouth daily before breakfast. 60 capsule 2   metFORMIN (GLUCOPHAGE) 500 MG tablet Take 2 tablets (1,000 mg total) by mouth daily. 180 tablet 3   metoprolol succinate (TOPROL-XL) 25 MG 24 hr tablet Take 1 tablet (25 mg total) by mouth daily. 90 tablet 1   pantoprazole (PROTONIX) 20 MG tablet Take 1 tablet (20 mg total) by mouth daily. 60 tablet 1   rosuvastatin (CRESTOR) 20 MG tablet Take 1 tablet (20 mg total) by mouth daily. 90 tablet 3   triamterene-hydrochlorothiazide (MAXZIDE-25) 37.5-25 MG tablet Take 1 tablet by mouth daily.  90 tablet 2   TRULICITY 0.75 MG/0.5ML SOPN INJECT 0.75MG  INTO THE SKIN ONCE WEEKLY 2 mL 3   No current facility-administered medications for this visit.    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)   TONSILLECTOMY      Family History  Problem Relation Age of Onset   Diabetes Mother    Hypertension Mother    Hypertension Father    Migraines Daughter    Asthma Child    Colon cancer Neg Hx    Colon polyps Neg Hx     Allergies as of 01/02/2023 - Review Complete 12/18/2022  Allergen Reaction Noted   Duloxetine Hives 09/24/2013   Fluoxetine Nausea And Vomiting 09/29/2015   Lactose intolerance (gi) Cough, Diarrhea, Hives, Itching, Palpitations, Rash, Shortness Of Breath, and Swelling 03/16/2022   Milk (cow) Hives 09/24/2013   Gabapentin Other (See Comments) 07/06/2016   Prozac [fluoxetine hcl] Nausea And Vomiting and Swelling 05/15/2013   Wellbutrin [bupropion] Hives 05/15/2013   Gluten meal Itching 08/19/2020    Social History   Socioeconomic History   Marital status: Married    Spouse name: Not on file   Number of children: Not on file   Years of education: Not on file   Highest education level: Associate degree: occupational, Scientist, product/process development, or vocational program  Occupational History   Not on file  Tobacco Use   Smoking status: Former    Current packs/day: 0.00    Average packs/day: 1 pack/day for 25.0 years (25.0 ttl pk-yrs)    Types: Cigarettes    Start date: 10/08/1997    Quit date: 10/09/2022    Years since quitting: 0.2   Smokeless tobacco: Never   Tobacco comments:    Quit   Vaping Use   Vaping status: Never Used  Substance and Sexual Activity   Alcohol use: No   Drug use: No   Sexual activity: Not on file  Other Topics Concern   Not on file  Social History Narrative   Not on file   Social Determinants of Health   Financial Resource Strain: Medium Risk (10/01/2022)   Overall Financial Resource Strain (CARDIA)    Difficulty of Paying Living Expenses: Somewhat hard  Food Insecurity: No Food Insecurity (10/01/2022)   Hunger Vital Sign    Worried About Running Out of Food  in the Last Year: Never true    Ran Out of Food in the Last Year: Never true  Transportation Needs: No Transportation Needs (10/01/2022)   PRAPARE - Administrator, Civil Service (Medical): No    Lack of Transportation (Non-Medical): No  Physical Activity: Unknown (10/01/2022)   Exercise Vital Sign    Days of Exercise per Week: 1 day    Minutes of Exercise per Session: Not on file  Stress: Not on file  Social Connections: Socially Integrated (10/01/2022)   Social Connection and Isolation Panel [NHANES]    Frequency of Communication with Friends and Family: Twice  a week    Frequency of Social Gatherings with Friends and Family: Once a week    Attends Religious Services: More than 4 times per year    Active Member of [employer] or Organizations: Yes    Attends Engineer, structural: More than 4 times per year    Marital Status: Married     Review of Systems   Gen: Denies fever, chills, anorexia. Denies fatigue, weakness, weight loss.  CV: Denies chest pain, palpitations, syncope, peripheral edema, and claudication. Resp: Denies dyspnea at rest, cough, wheezing, coughing up blood, and pleurisy. GI: See HPI Derm: Denies rash, itching, dry skin Psych: Denies depression, anxiety, memory loss, confusion. No homicidal or suicidal ideation.  Heme: Denies bruising, bleeding, and enlarged lymph nodes.   Physical Exam   There were no vitals taken for this visit.  General:   Alert and oriented. No distress noted. Pleasant and cooperative.  Head:  Normocephalic and atraumatic. Eyes:  Conjuctiva clear without scleral icterus. Mouth:  Oral mucosa pink and moist. Good dentition. No lesions. Lungs:  Clear to auscultation bilaterally. No wheezes, rales, or rhonchi. No distress.  Heart:  S1, S2 present without murmurs appreciated.  Abdomen:  +BS, soft, non-tender and non-distended. No rebound or guarding. No HSM or masses noted. Rectal: *** Msk:  Symmetrical without gross  deformities. Normal posture. Extremities:  Without edema. Neurologic:  Alert and  oriented x4 Psych:  Alert and cooperative. Normal mood and affect.   Assessment  ZYKERIAH MATHIA is a 52 y.o. female with a history of hydrocephalus, anxiety, HTN, and weight loss presenting today for follow up.   Weight loss:   Lack of appetite:   PLAN   ***     Brooke Bonito, MSN, FNP-BC, AGACNP-BC [facility]

## 2023-01-02 ENCOUNTER — Ambulatory Visit: Payer: 59 | Admitting: Gastroenterology

## 2023-01-03 ENCOUNTER — Ambulatory Visit (HOSPITAL_COMMUNITY)
Admission: RE | Admit: 2023-01-03 | Discharge: 2023-01-03 | Disposition: A | Discharge status: Home / Self Care | Payer: 59 | Source: Ambulatory Visit | Attending: Family Medicine | Admitting: Family Medicine

## 2023-01-03 ENCOUNTER — Encounter (HOSPITAL_COMMUNITY): Payer: Self-pay | Admitting: Radiology

## 2023-01-03 DIAGNOSIS — Z1231 Encounter for screening mammogram for malignant neoplasm of breast: Secondary | ICD-10-CM | POA: Diagnosis not present

## 2023-01-12 ENCOUNTER — Other Ambulatory Visit: Payer: Self-pay | Admitting: *Deleted

## 2023-01-12 DIAGNOSIS — Z87891 Personal history of nicotine dependence: Secondary | ICD-10-CM

## 2023-01-12 DIAGNOSIS — F17210 Nicotine dependence, cigarettes, uncomplicated: Secondary | ICD-10-CM

## 2023-01-12 DIAGNOSIS — Z122 Encounter for screening for malignant neoplasm of respiratory organs: Secondary | ICD-10-CM

## 2023-01-25 ENCOUNTER — Other Ambulatory Visit: Payer: Self-pay | Admitting: Family Medicine

## 2023-01-25 DIAGNOSIS — K219 Gastro-esophageal reflux disease without esophagitis: Secondary | ICD-10-CM

## 2023-01-30 ENCOUNTER — Ambulatory Visit (INDEPENDENT_AMBULATORY_CARE_PROVIDER_SITE_OTHER): Payer: 59 | Admitting: Acute Care

## 2023-01-30 ENCOUNTER — Encounter: Payer: Self-pay | Admitting: Acute Care

## 2023-01-30 DIAGNOSIS — F17210 Nicotine dependence, cigarettes, uncomplicated: Secondary | ICD-10-CM

## 2023-01-30 NOTE — Progress Notes (Signed)
Virtual Visit via Telephone Note  I connected with Brittney Reyes on 01/30/23 at  3:00 PM EDT by telephone and verified that I am speaking with the correct person using two identifiers.  Location: Patient:  At home Provider: [address], [place], [place], [address]    I discussed the limitations, risks, security and privacy concerns of performing an evaluation and management service by telephone and the availability of in person appointments. I also discussed with the patient that there may be a patient responsible charge related to this service. The patient expressed understanding and agreed to proceed.    Shared Decision Making Visit Lung Cancer Screening Program [phone])   Eligibility: Age 52 y.o. Pack Years Smoking History Calculation 36 pack year smoking history (# packs/per year x # years smoked) Recent History of coughing up blood  no Unexplained weight loss? no ( >Than 15 pounds within the last 6 months ) Prior History Lung / other cancer no (Diagnosis within the last 5 years already requiring surveillance chest CT Scans). Smoking Status Current Smoker Former Smokers: Years since quit:  NA  Quit Date: NA  Visit Components: Discussion included one or more decision making aids. yes Discussion included risk/benefits of screening. yes Discussion included potential follow up diagnostic testing for abnormal scans. yes Discussion included meaning and risk of over diagnosis. yes Discussion included meaning and risk of False Positives. yes Discussion included meaning of total radiation exposure. yes  Counseling Included: Importance of adherence to annual lung cancer LDCT screening. yes Impact of comorbidities on ability to participate in the program. yes Ability and willingness to under diagnostic treatment. yes  Smoking Cessation Counseling: Current Smokers:  Discussed importance of smoking cessation. yes Information about tobacco cessation classes and  interventions provided to patient. yes Patient provided with "ticket" for LDCT Scan. yes Symptomatic Patient. no  Counseling NA Diagnosis Code: Tobacco Use Z72.0 Asymptomatic Patient yes  Counseling (Intermediate counseling: > three minutes counseling) U0454 Former Smokers:  Discussed the importance of maintaining cigarette abstinence. yes Diagnosis Code: Personal History of Nicotine Dependence. U98.119 Information about tobacco cessation classes and interventions provided to patient. Yes Patient provided with "ticket" for LDCT Scan. yes Written Order for Lung Cancer Screening with LDCT placed in Epic. Yes (CT Chest Lung Cancer Screening Low Dose W/O CM) [id number] Z12.2-Screening of respiratory organs Z87.891-Personal history of nicotine dependence  I have spent 25 minutes of face to face/ virtual visit   time with  Brittney Reyes discussing the risks and benefits of lung cancer screening. We viewed / discussed a power point together that explained in detail the above noted topics. We paused at intervals to allow for questions to be asked and answered to ensure understanding.We discussed that the single most powerful action that she can take to decrease her risk of developing lung cancer is to quit smoking. We discussed whether or not she is ready to commit to setting a quit date. We discussed options for tools to aid in quitting smoking including nicotine replacement therapy, non-nicotine medications, support groups, Quit Smart classes, and behavior modification. We discussed that often times setting smaller, more achievable goals, such as eliminating 1 cigarette a day for a week and then 2 cigarettes a day for a week can be helpful in slowly decreasing the number of cigarettes smoked. This allows for a sense of accomplishment as well as providing a clinical benefit. I provided  her  with smoking cessation  information  with contact information for community resources, classes,  free nicotine replacement  therapy, and access to mobile apps, text messaging, and on-line smoking cessation help. I have also provided  her  the office contact information in the event she needs to contact me, or the screening staff. We discussed the time and location of the scan, and that either Abigail Miyamoto RN, Karlton Lemon, RN  or I will call / send a letter with the results within 24-72 hours of receiving them. The patient verbalized understanding of all of  the above and had no further questions upon leaving the office. They have my contact information in the event they have any further questions.  I spent 3-4 minutes counseling on smoking cessation and the health risks of continued tobacco abuse.  I explained to the patient that there has been a high incidence of coronary artery disease noted on these exams. I explained that this is a non-gated exam therefore degree or severity cannot be determined. This patient is on statin therapy. I have asked the patient to follow-up with their PCP regarding any incidental finding of coronary artery disease and management with diet or medication as their PCP  feels is clinically indicated. The patient verbalized understanding of the above and had no further questions upon completion of the visit.      [name], NP 01/30/2023

## 2023-01-30 NOTE — Patient Instructions (Signed)

## 2023-01-31 ENCOUNTER — Inpatient Hospital Stay: Admission: RE | Admit: 2023-01-31 | Payer: 59 | Source: Ambulatory Visit

## 2023-02-04 ENCOUNTER — Encounter: Payer: Self-pay | Admitting: Family Medicine

## 2023-02-13 ENCOUNTER — Encounter: Payer: Self-pay | Admitting: Obstetrics & Gynecology

## 2023-02-13 ENCOUNTER — Ambulatory Visit (INDEPENDENT_AMBULATORY_CARE_PROVIDER_SITE_OTHER): Payer: 59 | Admitting: Obstetrics & Gynecology

## 2023-02-13 VITALS — BP 138/73 | HR 73 | Ht 66.0 in | Wt 182.0 lb

## 2023-02-13 DIAGNOSIS — N938 Other specified abnormal uterine and vaginal bleeding: Secondary | ICD-10-CM

## 2023-02-13 DIAGNOSIS — D500 Iron deficiency anemia secondary to blood loss (chronic): Secondary | ICD-10-CM | POA: Diagnosis not present

## 2023-02-13 DIAGNOSIS — D219 Benign neoplasm of connective and other soft tissue, unspecified: Secondary | ICD-10-CM | POA: Diagnosis not present

## 2023-02-13 LAB — POCT HEMOGLOBIN: Hemoglobin: 10.1 g/dL — AB (ref 11–14.6)

## 2023-02-13 MED ORDER — MEGESTROL ACETATE 40 MG PO TABS: ORAL_TABLET | ORAL | 12 refills | Status: DC

## 2023-02-13 MED ORDER — MEGESTROL ACETATE 40 MG PO TABS: ORAL_TABLET | ORAL | 0 refills | Status: DC

## 2023-02-13 NOTE — Progress Notes (Signed)
Chief Complaint  Patient presents with   Gynecologic Exam    "I have been on my period for a solid month."      52 y.o. No obstetric history on file. Patient's last menstrual period was 01/13/2023 (exact date). The current method of family planning is tubal ligation.  Outpatient Encounter Medications as of 02/13/2023  Medication Sig   acetaminophen (TYLENOL) 500 MG tablet Take 1,000 mg by mouth daily as needed for headache.   famotidine (PEPCID) 10 MG tablet Take 10 mg by mouth 2 (two) times daily.   megestrol (MEGACE) 40 MG tablet 3 tablets a day for 5 days, 2 tablets a day for 5 days then 1 tablet daily   megestrol (MEGACE) 40 MG tablet Daily for 24 days, then off 4 days. Repeat   metFORMIN (GLUCOPHAGE) 500 MG tablet Take 2 tablets (1,000 mg total) by mouth daily.   rosuvastatin (CRESTOR) 20 MG tablet Take 1 tablet (20 mg total) by mouth daily.   triamterene-hydrochlorothiazide (MAXZIDE-25) 37.5-25 MG tablet Take 1 tablet by mouth daily.   cyclobenzaprine (FLEXERIL) 5 MG tablet Take 1 tablet (5 mg total) by mouth at bedtime. (Patient not taking: Reported on 02/13/2023)   metoprolol succinate (TOPROL-XL) 25 MG 24 hr tablet Take 1 tablet (25 mg total) by mouth daily.   pantoprazole (PROTONIX) 20 MG tablet TAKE ONE TABLET BY MOUTH ONCE DAILY (Patient not taking: Reported on 02/13/2023)   [DISCONTINUED] capsaicin (ZOSTRIX) 0.025 % cream Apply topically 2 (two) times daily.   [DISCONTINUED] linaclotide (LINZESS) 72 MCG capsule Take 1 capsule (72 mcg total) by mouth daily before breakfast.   [DISCONTINUED] TRULICITY 0.75 MG/0.5ML SOPN INJECT 0.75MG  INTO THE SKIN ONCE WEEKLY   No facility-administered encounter medications on file as of 02/13/2023.    Subjective Pt skipped her period for 4 months and then began bleeding for the past month Variable flow, color and cramps suggesting dys synchronous endometrium, obviously peri menopausal but does have fibroid on exam As well Past Medical  History:  Diagnosis Date   Anxiety    Diabetes mellitus without complication (HCC)    Hydrocephalus (HCC)    Hypercholesteremia    Hypertension     Past Surgical History:  Procedure Laterality Date   CESAREAN SECTION     CHOLECYSTECTOMY     COLONOSCOPY WITH PROPOFOL N/A 03/21/2022   Procedure: COLONOSCOPY WITH PROPOFOL;  Surgeon: Lanelle Bal, DO;  [facility];  Service: Endoscopy;  Laterality: N/A;  12:00pm, asa 2, pt knows to arrive at 8:00   LEEP SURGERY     SHUT IN STOMACH     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     Allergies  Allergen Reactions   Duloxetine Hives   Fluoxetine Nausea And Vomiting   Lactose Intolerance (Gi) Cough, Diarrhea, Hives, Itching, Palpitations, Rash, Shortness Of Breath and Swelling   Milk (Cow) Hives   Gabapentin Other (See Comments)    Dizziness and slurring of words on this medication   Prozac [Fluoxetine Hcl] Nausea And Vomiting and Swelling   Wellbutrin [Bupropion] Hives   Gluten Meal Itching    Social History   Socioeconomic History   Marital status: Married    Spouse name: Not on file   Number of children: 6   Years of education: Not on file   Highest education level: Associate degree: occupational, Scientist, product/process development, or vocational program  Occupational  History   Not on file  Tobacco Use   Smoking status: Every Day    Current packs/day: 1.00    Average packs/day: 1 pack/day for 36.0 years (36.0 ttl pk-yrs)    Types: Cigarettes    Start date: 01/30/1987   Smokeless tobacco: Never   Tobacco comments:    Quit   Vaping Use   Vaping status: Never Used  Substance and Sexual Activity   Alcohol use: No   Drug use: No   Sexual activity: Yes    Birth control/protection: Surgical    Comment: Tubal  Other Topics Concern   Not on file  Social History Narrative   Not on file   Social Determinants of Health   Financial Resource Strain: Medium Risk (02/13/2023)    Overall Financial Resource Strain (CARDIA)    Difficulty of Paying Living Expenses: Somewhat hard  Food Insecurity: No Food Insecurity (02/13/2023)   Hunger Vital Sign    Worried About Running Out of Food in the Last Year: Never true    Ran Out of Food in the Last Year: Never true  Transportation Needs: No Transportation Needs (02/13/2023)   PRAPARE - Administrator, Civil Service (Medical): No    Lack of Transportation (Non-Medical): No  Physical Activity: Insufficiently Active (02/13/2023)   Exercise Vital Sign    Days of Exercise per Week: 3 days    Minutes of Exercise per Session: 20 min  Stress: Stress Concern Present (02/13/2023)   [name] of Occupational Health - Occupational Stress Questionnaire    Feeling of Stress : Rather much  Social Connections: Socially Integrated (02/13/2023)   Social Connection and Isolation Panel [NHANES]    Frequency of Communication with Friends and Family: Once a week    Frequency of Social Gatherings with Friends and Family: More than three times a week    Attends Religious Services: More than 4 times per year    Active Member of [employer] or Organizations: Yes    Attends Engineer, structural: More than 4 times per year    Marital Status: Married    Family History  Problem Relation Age of Onset   Diabetes Mother    Hypertension Mother    Hypertension Father    Migraines Daughter    Asthma Child    Colon cancer Neg Hx    Colon polyps Neg Hx     Medications:       Current Outpatient Medications:    acetaminophen (TYLENOL) 500 MG tablet, Take 1,000 mg by mouth daily as needed for headache., Disp: , Rfl:    famotidine (PEPCID) 10 MG tablet, Take 10 mg by mouth 2 (two) times daily., Disp: , Rfl:    megestrol (MEGACE) 40 MG tablet, 3 tablets a day for 5 days, 2 tablets a day for 5 days then 1 tablet daily, Disp: 45 tablet, Rfl: 0   megestrol (MEGACE) 40 MG tablet, Daily for 24 days, then off 4 days. Repeat, Disp: 24 tablet,  Rfl: 12   metFORMIN (GLUCOPHAGE) 500 MG tablet, Take 2 tablets (1,000 mg total) by mouth daily., Disp: 180 tablet, Rfl: 3   rosuvastatin (CRESTOR) 20 MG tablet, Take 1 tablet (20 mg total) by mouth daily., Disp: 90 tablet, Rfl: 3   triamterene-hydrochlorothiazide (MAXZIDE-25) 37.5-25 MG tablet, Take 1 tablet by mouth daily., Disp: 90 tablet, Rfl: 2   cyclobenzaprine (FLEXERIL) 5 MG tablet, Take 1 tablet (5 mg total) by mouth at bedtime. (Patient not taking: Reported on 02/13/2023),  Disp: 30 tablet, Rfl: 1   metoprolol succinate (TOPROL-XL) 25 MG 24 hr tablet, Take 1 tablet (25 mg total) by mouth daily., Disp: 90 tablet, Rfl: 1   pantoprazole (PROTONIX) 20 MG tablet, TAKE ONE TABLET BY MOUTH ONCE DAILY (Patient not taking: Reported on 02/13/2023), Disp: 60 tablet, Rfl: 1  Objective Blood pressure 138/73, pulse 73, height 5\' 6"  (1.676 m), weight 182 lb (82.6 kg), last menstrual period 01/13/2023.  General WDWN female NAD Vulva:  normal appearing vulva with no masses, tenderness or lesions Vagina:  normal mucosa, no discharge Cervix:  Normal no lesions Uterus:  posterior and retroverted fills the pelvic bowl so like 12 weeks size Adnexa: ovaries:present,  normal adnexa in size, nontender and no masses   Pertinent ROS No burning with urination, frequency or urgency No nausea, vomiting or diarrhea Nor fever chills or other constitutional symptoms   Labs or studies Hemoglobin 10.1    Impression + Management Plan: Diagnoses this Encounter::   ICD-10-CM   1. DUB (dysfunctional uterine bleeding)  N93.8    Megace high dose algorithm for control then 24/4 cycling    2. Fibroids  D21.9    quite posterior retroverted uterus fills the pelvic "bowl", sonogram to document size    3. Anemia due to chronic blood loss: hemoglobin 10.1 today  D50.0         Medications prescribed during  this encounter: Meds ordered this encounter  Medications   megestrol (MEGACE) 40 MG tablet    Sig: 3  tablets a day for 5 days, 2 tablets a day for 5 days then 1 tablet daily    Dispense:  45 tablet    Refill:  0   megestrol (MEGACE) 40 MG tablet    Sig: Daily for 24 days, then off 4 days. Repeat    Dispense:  24 tablet    Refill:  12    Labs or Scans Ordered during this encounter: No orders of the defined types were placed in this encounter.     Follow up Return for schedule GYN sonogram, follow up appt with Dr Despina Hidden 3 months.

## 2023-02-13 NOTE — Addendum Note (Signed)
Addended by: Freddie Apley R on: 02/13/2023 11:01 AM   Modules accepted: Orders

## 2023-02-15 ENCOUNTER — Ambulatory Visit
Admission: RE | Admit: 2023-02-15 | Discharge: 2023-02-15 | Disposition: A | Discharge status: Home / Self Care | Payer: 59 | Source: Ambulatory Visit | Attending: Acute Care | Admitting: Acute Care

## 2023-02-15 DIAGNOSIS — F17210 Nicotine dependence, cigarettes, uncomplicated: Secondary | ICD-10-CM

## 2023-02-15 DIAGNOSIS — Z122 Encounter for screening for malignant neoplasm of respiratory organs: Secondary | ICD-10-CM

## 2023-02-15 DIAGNOSIS — Z87891 Personal history of nicotine dependence: Secondary | ICD-10-CM

## 2023-02-16 ENCOUNTER — Encounter: Payer: Self-pay | Admitting: Internal Medicine

## 2023-02-16 ENCOUNTER — Ambulatory Visit: Payer: 59 | Attending: Internal Medicine | Admitting: Internal Medicine

## 2023-02-16 ENCOUNTER — Ambulatory Visit: Payer: 59 | Admitting: Internal Medicine

## 2023-02-16 VITALS — BP 136/82 | HR 74 | Ht 66.0 in | Wt 179.0 lb

## 2023-02-16 DIAGNOSIS — R42 Dizziness and giddiness: Secondary | ICD-10-CM | POA: Insufficient documentation

## 2023-02-16 MED ORDER — MECLIZINE HCL 25 MG PO TABS: 25.0000 mg | ORAL_TABLET | Freq: Three times a day (TID) | ORAL | 3 refills | Status: AC

## 2023-02-16 NOTE — Progress Notes (Signed)
Cardiology Office Note  Date: 02/16/2023   ID: Brittney Reyes, DOB [birth year], MRN [id number]  PCP:  Gilmore Laroche, FNP  Cardiologist:  Marjo Bicker, MD Electrophysiologist:  None   History of Present Illness: Brittney Reyes is a 52 y.o. female known to have HTN was referred to cardiology clinic for evaluation of palpitations, to establish care.  Patient has dizziness after she wakes up from bed in the morning, when she climbs stairs etc. this last for 1 hour.  She also gets dizzy associated with SOB and palpitations when she tries to help her sick stepmother who has been bedridden and weighs 400 pounds.  Patient is not active at baseline, can do her ADLs and household chores but not any strenuous activities.  No DOE, orthopnea, PND, syncope.  She was being followed by cardiology at [facility] last visit was in 2020.  I reviewed the event monitor from 2018 which showed 1 run of brief SVT lasting 7 beats but otherwise unremarkable.  Her symptoms correlated with NSR and PVC given the visit burden was less than 1%.  Echocardiogram, NM stress test were within normal limits.  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     cyclobenzaprine (FLEXERIL) 5 MG tablet Take 1 tablet (5 mg total) by mouth at  bedtime. 30 tablet 1   ferrous sulfate ([employer] IRON) 325 (65 FE) MG tablet Take 325 mg by mouth daily with breakfast.     meclizine (ANTIVERT) 25 MG tablet Take 1 tablet (25 mg total) by mouth 3 (three) times daily. 270 tablet 3   megestrol (MEGACE) 40 MG tablet 3 tablets a day for 5 days, 2 tablets a day for 5 days then 1 tablet daily 45 tablet 0   metFORMIN (GLUCOPHAGE) 500 MG tablet Take 2 tablets (1,000 mg total) by mouth daily. 180 tablet 3   metoprolol succinate (TOPROL-XL) 25 MG 24 hr tablet Take 1 tablet (25 mg total) by mouth daily. 90 tablet 1   omeprazole (PRILOSEC) 20 MG capsule Take 20 mg by mouth daily.     rosuvastatin (CRESTOR) 20 MG tablet Take 1 tablet (20 mg total) by mouth daily. 90 tablet 3   triamterene-hydrochlorothiazide (MAXZIDE-25) 37.5-25 MG tablet Take 1 tablet by mouth daily. 90 tablet 2   No current facility-administered medications for this visit.   Allergies:  Duloxetine, Fluoxetine, Lactose intolerance (gi), Milk (cow), Gabapentin, Prozac [fluoxetine hcl], Wellbutrin [bupropion], and Gluten meal   Social History: The patient  reports that she has been smoking cigarettes. She started smoking about 36 years ago. She has a 36 pack-year smoking history. She has never used smokeless tobacco. She reports that she does not drink alcohol  and does not use drugs.   Family History: The patient's family history includes Asthma in her child; Diabetes in her mother; Hypertension in her father and mother; Migraines in her daughter.   ROS:  Please see the history of present illness. Otherwise, complete review of systems is positive for none.  All other systems are reviewed and negative.   Physical Exam: VS:  BP 136/82 (BP Location: Right Arm, Patient Position: Sitting, Cuff Size: Normal)   Pulse 74   Ht 5\' 6"  (1.676 m)   Wt 179 lb (81.2 kg)   LMP 01/13/2023 (Exact Date) Comment: Previous period 5 months ago, periods were regular prior to the one 5 months ago  SpO2 97%   BMI  28.89 kg/m , BMI Body mass index is 28.89 kg/m.  Wt Readings from Last 3 Encounters:  02/16/23 179 lb (81.2 kg)  02/13/23 182 lb (82.6 kg)  12/18/22 176 lb (79.8 kg)    General: Patient appears comfortable at rest. HEENT: Conjunctiva and lids normal, oropharynx clear with moist mucosa. Neck: Supple, no elevated JVP or carotid bruits, no thyromegaly. Lungs: Clear to auscultation, nonlabored breathing at rest. Cardiac: Regular rate and rhythm, no S3 or significant systolic murmur, no pericardial rub. Abdomen: Soft, nontender, no hepatomegaly, bowel sounds present, no guarding or rebound. Extremities: No pitting edema, distal pulses 2+. Skin: Warm and dry. Musculoskeletal: No kyphosis. Neuropsychiatric: Alert and oriented x3, affect grossly appropriate.  Recent Labwork: 12/19/2022: ALT 16; AST 15; BUN 6; Creatinine, Ser 0.69; Platelets 375; Potassium 3.6; Sodium 136; TSH 1.540 02/13/2023: Hemoglobin 10.1     Component Value Date/Time   CHOL 138 12/19/2022 1024   TRIG 111 12/19/2022 1024   HDL 78 12/19/2022 1024   CHOLHDL 1.8 12/19/2022 1024   LDLCALC 40 12/19/2022 1024     Assessment and Plan:  Dizziness likely secondary to vertigo: Dizziness occurs fasting in the morning when she wakes up from the bed, when she climbs stairs etc. and last for an hour. She previously tried meclizine and this improved her dizziness. She describes dizziness as room spinning sensation. Start meclizine 25 mg TID.  Palpitations: Patient not active at baseline.  She takes care of her sick stepmother who is 400 pounds and she helps her out in changing which is when she experiences SOB and palpitations.  I reviewed the event monitor from 2018 which was unremarkable except for brief run of SVT lasting for 7 beats.  She was initially started on carvedilol that was switched to metoprolol.  This did not affect her palpitations.  I think her palpitations are from deconditioning, she will need to improve her  endurance after improvement in her vertigo.  HTN, controlled: Follows with PCP, continue triamterene-HCTZ 37.5-25 mg once daily and metoprolol succinate 25 mg once daily.  HLD, at goal: LDL 40 in 7/24.  Goal LDL less than 100.  Currently taking rosuvastatin 20 mg nightly which can be decreased to 10 mg nightly,.    Medication Adjustments/Labs and Tests Ordered: Current medicines are reviewed at length with the patient today.  Concerns regarding medicines are outlined above.    Disposition:  Follow up 6 months  Signed, Ruchel Brandenburger Verne Spurr, MD, 02/16/2023 10:28 AM    [facility] [address] [place], [place] [address]

## 2023-02-16 NOTE — Patient Instructions (Signed)
Medication Instructions:  Your physician has recommended you make the following change in your medication:   -Start Meclizine 25 mg tablet three time daily.   *If you need a refill on your cardiac medications before your next appointment, please call your pharmacy*   Lab Work: None If you have labs (blood work) drawn today and your tests are completely normal, you will receive your results only by: MyChart Message (if you have MyChart) OR A paper copy in the mail If you have any lab test that is abnormal or we need to change your treatment, we will call you to review the results.   Testing/Procedures: None   Follow-Up: At [facility], you and your health needs are our priority.  As part of our continuing mission to provide you with exceptional heart care, we have created designated Provider Care Teams.  These Care Teams include your primary Cardiologist (physician) and Advanced Practice Providers (APPs -  Physician Assistants and Nurse Practitioners) who all work together to provide you with the care you need, when you need it.  We recommend signing up for the patient portal called "MyChart".  Sign up information is provided on this After Visit Summary.  MyChart is used to connect with patients for Virtual Visits (Telemedicine).  Patients are able to view lab/test results, encounter notes, upcoming appointments, etc.  Non-urgent messages can be sent to your provider as well.   To learn more about what you can do with MyChart, go to [email or web address].    Your next appointment:   6 month(s)  Provider:   Luane School, MD    Other Instructions

## 2023-02-23 ENCOUNTER — Encounter: Payer: Self-pay | Admitting: Family Medicine

## 2023-02-26 ENCOUNTER — Other Ambulatory Visit: Payer: Self-pay | Admitting: Acute Care

## 2023-02-26 ENCOUNTER — Telehealth: Payer: Self-pay | Admitting: Family Medicine

## 2023-02-26 DIAGNOSIS — Z122 Encounter for screening for malignant neoplasm of respiratory organs: Secondary | ICD-10-CM

## 2023-02-26 DIAGNOSIS — F17210 Nicotine dependence, cigarettes, uncomplicated: Secondary | ICD-10-CM

## 2023-02-26 DIAGNOSIS — Z87891 Personal history of nicotine dependence: Secondary | ICD-10-CM

## 2023-02-26 NOTE — Telephone Encounter (Signed)
Tried returning call number not able to be reached.

## 2023-02-26 NOTE — Telephone Encounter (Signed)
[employer] calling needing clarification on pt glucose monitor. Please advise [phone] Thank you

## 2023-02-28 ENCOUNTER — Other Ambulatory Visit: Payer: Self-pay | Admitting: Family Medicine

## 2023-02-28 DIAGNOSIS — I10 Essential (primary) hypertension: Secondary | ICD-10-CM

## 2023-03-02 ENCOUNTER — Telehealth: Payer: Self-pay | Admitting: Family Medicine

## 2023-03-02 NOTE — Telephone Encounter (Signed)
Gavin Pound called from [employer] checking on status of the glucose monitor status needs a diagnostic code , information be faxed to [phone].  Any questions call [phone]

## 2023-03-05 NOTE — Telephone Encounter (Signed)
Unable to get through to anyone

## 2023-03-07 ENCOUNTER — Other Ambulatory Visit: Payer: Self-pay | Admitting: Family Medicine

## 2023-03-07 DIAGNOSIS — J438 Other emphysema: Secondary | ICD-10-CM

## 2023-03-07 NOTE — Progress Notes (Signed)
I called and spoke with the patient to review her CT scan results of the lungs. I informed her that the imaging study indicated the presence of emphysema. The patient expressed interest in following up with a pulmonologist, and a referral has been placed for her.

## 2023-03-15 ENCOUNTER — Other Ambulatory Visit: Payer: Self-pay | Admitting: Obstetrics & Gynecology

## 2023-03-15 DIAGNOSIS — D219 Benign neoplasm of connective and other soft tissue, unspecified: Secondary | ICD-10-CM

## 2023-03-15 DIAGNOSIS — N938 Other specified abnormal uterine and vaginal bleeding: Secondary | ICD-10-CM

## 2023-03-15 NOTE — Progress Notes (Signed)
US order

## 2023-03-19 ENCOUNTER — Ambulatory Visit (INDEPENDENT_AMBULATORY_CARE_PROVIDER_SITE_OTHER): Payer: 59 | Admitting: Radiology

## 2023-03-19 DIAGNOSIS — N938 Other specified abnormal uterine and vaginal bleeding: Secondary | ICD-10-CM | POA: Diagnosis not present

## 2023-03-19 DIAGNOSIS — D219 Benign neoplasm of connective and other soft tissue, unspecified: Secondary | ICD-10-CM

## 2023-03-19 NOTE — Progress Notes (Signed)
TA and TV U/S performed    Tammy - Chaperone Retroverted enlarged ut with several intramural fibroids <= 17 mm Heterogenous myometrium  Thickened em cavity without definite focal mass ? Endometrial hypertrophy Em thickness = 23 mm Normal ov's  -  neg adn regions  -  neg CDS,  no Free Fluid   P. Kehinde Bowdish, RDMS

## 2023-03-20 ENCOUNTER — Encounter: Payer: Self-pay | Admitting: Family Medicine

## 2023-03-20 ENCOUNTER — Ambulatory Visit (INDEPENDENT_AMBULATORY_CARE_PROVIDER_SITE_OTHER): Payer: 59 | Admitting: Family Medicine

## 2023-03-20 VITALS — BP 126/85 | HR 76 | Ht 66.0 in | Wt 168.1 lb

## 2023-03-20 DIAGNOSIS — E119 Type 2 diabetes mellitus without complications: Secondary | ICD-10-CM | POA: Diagnosis not present

## 2023-03-20 DIAGNOSIS — H6121 Impacted cerumen, right ear: Secondary | ICD-10-CM

## 2023-03-20 DIAGNOSIS — W19XXXA Unspecified fall, initial encounter: Secondary | ICD-10-CM

## 2023-03-20 DIAGNOSIS — I10 Essential (primary) hypertension: Secondary | ICD-10-CM

## 2023-03-20 DIAGNOSIS — Z794 Long term (current) use of insulin: Secondary | ICD-10-CM | POA: Diagnosis not present

## 2023-03-20 DIAGNOSIS — E038 Other specified hypothyroidism: Secondary | ICD-10-CM

## 2023-03-20 DIAGNOSIS — E559 Vitamin D deficiency, unspecified: Secondary | ICD-10-CM | POA: Diagnosis not present

## 2023-03-20 DIAGNOSIS — E785 Hyperlipidemia, unspecified: Secondary | ICD-10-CM | POA: Diagnosis not present

## 2023-03-20 DIAGNOSIS — R7301 Impaired fasting glucose: Secondary | ICD-10-CM

## 2023-03-20 DIAGNOSIS — Z23 Encounter for immunization: Secondary | ICD-10-CM

## 2023-03-20 DIAGNOSIS — E7849 Other hyperlipidemia: Secondary | ICD-10-CM

## 2023-03-20 MED ORDER — TRIAMTERENE-HCTZ 37.5-25 MG PO TABS: 1.0000 | ORAL_TABLET | Freq: Every day | ORAL | 2 refills | Status: DC

## 2023-03-20 NOTE — Patient Instructions (Signed)
I appreciate the opportunity to provide care to you today!    Follow up:  4 months  Labs: please stop by the lab today to get your blood drawn (CBC, CMP, TSH, Lipid profile, HgA1c, Vit D)   For managing diabetes, I recommend the following lifestyle changes:  Reduce Intake of High-Sugar Foods and Beverages: Limit foods and drinks high in sugar to help regulate blood sugar levels. Increase Consumption of Nutrient-Rich Foods: Focus on incorporating more fruits, vegetables, and whole grains into your diet. Choose Lean Proteins: Opt for lean proteins such as chicken, fish, beans, and legumes. Select Low-Fat Dairy Products: Choose low-fat or non-fat dairy options. Minimize Saturated Fats, Trans Fats, and Cholesterol: Reduce intake of foods high in saturated fats, trans fatty acids, and cholesterol. Engage in Regular Physical Activity: Aim for at least 30 minutes of brisk walking or other moderate activity at least 5 days a week.     Please continue to a heart-healthy diet and increase your physical activities. Try to exercise for at least five days a week.    It was a pleasure to see you and I look forward to continuing to work together on your health and well-being. Please do not hesitate to call the office if you need care or have questions about your care.  In case of emergency, please visit the Emergency Department for urgent care, or contact our clinic at [phone] to schedule an appointment. We're here to help you!   Have a wonderful day and week. With Gratitude, Gilmore Laroche MSN, FNP-BC

## 2023-03-20 NOTE — Progress Notes (Unsigned)
bas  Established Patient Office Visit  Subjective:  Patient ID: Brittney Reyes, female    DOB: 1971-02-21  Age: 52 y.o. MRN: [id number]  CC:  Chief Complaint  Patient presents with   Care Management    3 month f/u, reports ear clogging 2 days ago.    Fall    Pt reports two falls in a week. Hurt her right elbow.     HPI Brittney Reyes is a 52 y.o. female with past medical history of hyperlipidemia, type 2 diabetes, and hypertension presents for f/u of  chronic medical conditions. For the details of today's visit, please refer to the assessment and plan.    Fall: She reports sustaining a fall 2 weeks ago, during which she hit her right elbow. She treated the injury with supportive care at home and denies any current symptoms of pain, swelling, decreased range of motion, numbness, or tingling in the affected arm.    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)   TONSILLECTOMY      Family History  Problem Relation Age of Onset   Diabetes Mother    Hypertension Mother    Hypertension Father    Migraines Daughter    Asthma Child    Colon cancer Neg Hx    Colon polyps Neg Hx     Social History   Socioeconomic History   Marital status: Married    Spouse name: Not on file   Number of children: 6   Years of education: Not on file   Highest education level: Associate degree: occupational, Scientist, product/process development, or vocational program  Occupational History   Not on file  Tobacco Use    Smoking status: Every Day    Current packs/day: 1.00    Average packs/day: 1 pack/day for 36.1 years (36.1 ttl pk-yrs)    Types: Cigarettes    Start date: 01/30/1987   Smokeless tobacco: Never   Tobacco comments:    Quit   Vaping Use   Vaping status: Never Used  Substance and Sexual Activity   Alcohol use: No   Drug use: No   Sexual activity: Yes    Birth control/protection: Surgical    Comment: Tubal  Other Topics Concern   Not on file  Social History Narrative   Not on file   Social Determinants of Health   Financial Resource Strain: Medium Risk (02/13/2023)   Overall Financial Resource Strain (CARDIA)    Difficulty of Paying Living Expenses: Somewhat hard  Food Insecurity: No Food Insecurity (02/13/2023)   Hunger Vital Sign    Worried About Running Out of Food in the Last Year: Never true    Ran Out of Food in the Last Year: Never true  Transportation Needs: No Transportation Needs (02/13/2023)   PRAPARE - Administrator, Civil Service (Medical): No    Lack of Transportation (Non-Medical): No  Physical  Activity: Insufficiently Active (02/13/2023)   Exercise Vital Sign    Days of Exercise per Week: 3 days    Minutes of Exercise per Session: 20 min  Stress: Stress Concern Present (02/13/2023)   [name] of Occupational Health - Occupational Stress Questionnaire    Feeling of Stress : Rather much  Social Connections: Socially Integrated (02/13/2023)   Social Connection and Isolation Panel [NHANES]    Frequency of Communication with Friends and Family: Once a week    Frequency of Social Gatherings with Friends and Family: More than three times a week    Attends Religious Services: More than 4 times per year    Active Member of [employer] or Organizations: Yes    Attends Engineer, structural: More than 4 times per year    Marital Status: Married  Catering manager Violence: Not At Risk (02/13/2023)   Humiliation, Afraid, Rape, and Kick questionnaire    Fear of  Current or Ex-Partner: No    Emotionally Abused: No    Physically Abused: No    Sexually Abused: No    Outpatient Medications Prior to Visit  Medication Sig Dispense Refill   acetaminophen (TYLENOL) 500 MG tablet Take 1,000 mg by mouth daily as needed for headache.     cyclobenzaprine (FLEXERIL) 5 MG tablet Take 1 tablet (5 mg total) by mouth at bedtime. 30 tablet 1   ferrous sulfate ([employer] IRON) 325 (65 FE) MG tablet Take 325 mg by mouth daily with breakfast.     meclizine (ANTIVERT) 25 MG tablet Take 1 tablet (25 mg total) by mouth 3 (three) times daily. 270 tablet 3   megestrol (MEGACE) 40 MG tablet 3 tablets a day for 5 days, 2 tablets a day for 5 days then 1 tablet daily 45 tablet 0   metFORMIN (GLUCOPHAGE) 500 MG tablet Take 2 tablets (1,000 mg total) by mouth daily. 180 tablet 3   metoprolol succinate (TOPROL-XL) 25 MG 24 hr tablet TAKE ONE TABLET BY MOUTH EVERY DAY 90 tablet 1   omeprazole (PRILOSEC) 20 MG capsule Take 20 mg by mouth daily.     rosuvastatin (CRESTOR) 20 MG tablet Take 1 tablet (20 mg total) by mouth daily. 90 tablet 3   triamterene-hydrochlorothiazide (MAXZIDE-25) 37.5-25 MG tablet Take 1 tablet by mouth daily. 90 tablet 2   No facility-administered medications prior to visit.    Allergies  Allergen Reactions   Duloxetine Hives   Fluoxetine Nausea And Vomiting   Lactose Intolerance (Gi) Cough, Diarrhea, Hives, Itching, Palpitations, Rash, Shortness Of Breath and Swelling   Milk (Cow) Hives   Gabapentin Other (See Comments)    Dizziness and slurring of words on this medication   Prozac [Fluoxetine Hcl] Nausea And Vomiting and Swelling   Wellbutrin [Bupropion] Hives   Gluten Meal Itching    ROS Review of Systems  Constitutional:  Negative for chills and fever.  Eyes:  Negative for visual disturbance.  Respiratory:  Negative for chest tightness and shortness of breath.   Neurological:  Negative for dizziness and headaches.      Objective:    Physical  Exam HENT:     Right Ear: Tympanic membrane and external ear normal. There is impacted cerumen.     Left Ear: Tympanic membrane and external ear normal. There is no impacted cerumen.     BP 126/85   Pulse 76   Ht 5\' 6"  (1.676 m)   Wt 168 lb 1.3 oz (76.2 kg)   SpO2 97%  BMI 27.13 kg/m  Wt Readings from Last 3 Encounters:  03/20/23 168 lb 1.3 oz (76.2 kg)  02/16/23 179 lb (81.2 kg)  02/13/23 182 lb (82.6 kg)    Lab Results  Component Value Date   TSH 1.340 03/20/2023   Lab Results  Component Value Date   WBC 11.5 (H) 03/20/2023   HGB 12.9 03/20/2023   HCT 39.3 03/20/2023   MCV 89 03/20/2023   PLT 353 03/20/2023   Lab Results  Component Value Date   NA 139 03/20/2023   K 3.3 (L) 03/20/2023   CO2 21 03/20/2023   GLUCOSE 108 (H) 03/20/2023   BUN 8 03/20/2023   CREATININE 0.81 03/20/2023   BILITOT <0.2 03/20/2023   ALKPHOS 70 03/20/2023   AST 20 03/20/2023   ALT 22 03/20/2023   PROT 7.1 03/20/2023   ALBUMIN 4.9 03/20/2023   CALCIUM 9.4 03/20/2023   EGFR 88 03/20/2023   Lab Results  Component Value Date   CHOL 84 (L) 03/20/2023   Lab Results  Component Value Date   HDL 42 03/20/2023   Lab Results  Component Value Date   LDLCALC 24 03/20/2023   Lab Results  Component Value Date   TRIG 90 03/20/2023   Lab Results  Component Value Date   CHOLHDL 2.0 03/20/2023   Lab Results  Component Value Date   HGBA1C 6.3 (H) 03/20/2023      Assessment & Plan:  Primary hypertension Assessment & Plan: Controlled BP is stable on metoprolol 25 mg daily, and Maxzide-25 mg daily She denies headaches, dizziness and chest pain Low-sodium diet with increase physical activity encouraged  BP Readings from Last 3 Encounters:  03/20/23 126/85  02/16/23 136/82  02/13/23 138/73     Orders: -     Triamterene-HCTZ; Take 1 tablet by mouth daily.  Dispense: 90 tablet; Refill: 2 -     TSH + free T4 -     CMP14+EGFR -     CBC with Differential/Platelet  Type 2  diabetes mellitus without complication, with long-term current use of insulin (HCC) Assessment & Plan: Lab Results  Component Value Date   HGBA1C 6.3 (H) 03/20/2023  She takes metformin 1000 mg daily No reports of polyuria, polyphagia, polydipsia Encouraged decreasing her intake of high sugar foods and beverages with increased risk activity  Orders: -     Microalbumin / creatinine urine ratio -     Hemoglobin A1c  Dyslipidemia, goal LDL below 100 Assessment & Plan: She takes rosuvastatin 20 g daily Will consider decreasing rosuvastatin to 10 mg daily after reviewing her labs Denies muscle aches and pain Encouraged to continue decreasing her intake of greasy, fatty, starchy foods with increase physical therapy Lab Results  Component Value Date   CHOL 84 (L) 03/20/2023   HDL 42 03/20/2023   LDLCALC 24 03/20/2023   TRIG 90 03/20/2023   CHOLHDL 2.0 03/20/2023     Orders: -     Lipid panel  Fall, initial encounter Assessment & Plan: Encouraged to continue supportive care and follow-up as needed.   Impacted cerumen, right ear Assessment & Plan: Ear irrigation performed   Vitamin D deficiency -     VITAMIN D 25 Hydroxy (Vit-D Deficiency, Fractures)  Encounter for immunization -     Flu vaccine trivalent PF, 6mos and older(Flulaval,Afluria,Fluarix,Fluzone) -     Varicella-zoster vaccine IM  Note: This chart has been completed using Engineer, civil (consulting) software, and while attempts have been made to ensure accuracy, certain words  and phrases may not be transcribed as intended.    Follow-up: Return in about 4 months (around 07/21/2023).   Gilmore Laroche, FNP

## 2023-03-21 DIAGNOSIS — H6121 Impacted cerumen, right ear: Secondary | ICD-10-CM | POA: Insufficient documentation

## 2023-03-21 DIAGNOSIS — W19XXXA Unspecified fall, initial encounter: Secondary | ICD-10-CM | POA: Insufficient documentation

## 2023-03-21 DIAGNOSIS — J301 Allergic rhinitis due to pollen: Secondary | ICD-10-CM | POA: Insufficient documentation

## 2023-03-21 NOTE — Assessment & Plan Note (Signed)
Encouraged to continue supportive care and follow-up as needed.

## 2023-03-21 NOTE — Assessment & Plan Note (Signed)
Controlled BP is stable on metoprolol 25 mg daily, and Maxzide-25 mg daily She denies headaches, dizziness and chest pain Low-sodium diet with increase physical activity encouraged  BP Readings from Last 3 Encounters:  03/20/23 126/85  02/16/23 136/82  02/13/23 138/73

## 2023-03-21 NOTE — Assessment & Plan Note (Signed)
She takes rosuvastatin 20 g daily Will consider decreasing rosuvastatin to 10 mg daily after reviewing her labs Denies muscle aches and pain Encouraged to continue decreasing her intake of greasy, fatty, starchy foods with increase physical therapy Lab Results  Component Value Date   CHOL 84 (L) 03/20/2023   HDL 42 03/20/2023   LDLCALC 24 03/20/2023   TRIG 90 03/20/2023   CHOLHDL 2.0 03/20/2023

## 2023-03-21 NOTE — Assessment & Plan Note (Signed)
Ear irrigation performed.

## 2023-03-21 NOTE — Assessment & Plan Note (Addendum)
Lab Results  Component Value Date   HGBA1C 6.3 (H) 03/20/2023  She takes metformin 1000 mg daily No reports of polyuria, polyphagia, polydipsia Encouraged decreasing her intake of high sugar foods and beverages with increased risk activity

## 2023-03-22 LAB — MICROALBUMIN / CREATININE URINE RATIO
Creatinine, Urine: 114.7 mg/dL
Microalb/Creat Ratio: 21 mg/g{creat} (ref 0–29)
Microalbumin, Urine: 24.3 ug/mL

## 2023-03-22 LAB — CBC WITH DIFFERENTIAL/PLATELET
Basophils Absolute: 0.1 10*3/uL (ref 0.0–0.2)
Basos: 0 %
EOS (ABSOLUTE): 0.1 10*3/uL (ref 0.0–0.4)
Eos: 1 %
Hematocrit: 39.3 % (ref 34.0–46.6)
Hemoglobin: 12.9 g/dL (ref 11.1–15.9)
Immature Grans (Abs): 0 10*3/uL (ref 0.0–0.1)
Immature Granulocytes: 0 %
Lymphocytes Absolute: 3.2 10*3/uL — ABNORMAL HIGH (ref 0.7–3.1)
Lymphs: 28 %
MCH: 29.3 pg (ref 26.6–33.0)
MCHC: 32.8 g/dL (ref 31.5–35.7)
MCV: 89 fL (ref 79–97)
Monocytes Absolute: 0.9 10*3/uL (ref 0.1–0.9)
Monocytes: 7 %
Neutrophils Absolute: 7.3 10*3/uL — ABNORMAL HIGH (ref 1.4–7.0)
Neutrophils: 64 %
Platelets: 353 10*3/uL (ref 150–450)
RBC: 4.41 x10E6/uL (ref 3.77–5.28)
RDW: 18.7 % — ABNORMAL HIGH (ref 11.7–15.4)
WBC: 11.5 10*3/uL — ABNORMAL HIGH (ref 3.4–10.8)

## 2023-03-22 LAB — CMP14+EGFR
ALT: 22 [IU]/L (ref 0–32)
AST: 20 [IU]/L (ref 0–40)
Albumin: 4.9 g/dL (ref 3.9–4.9)
Alkaline Phosphatase: 70 [IU]/L (ref 44–121)
BUN/Creatinine Ratio: 10 (ref 9–23)
BUN: 8 mg/dL (ref 6–24)
Bilirubin Total: <0.2 mg/dL (ref 0.0–1.2)
CO2: 21 mmol/L (ref 20–29)
Calcium: 9.4 mg/dL (ref 8.7–10.2)
Chloride: 100 mmol/L (ref 96–106)
Creatinine, Ser: 0.81 mg/dL (ref 0.57–1.00)
Globulin, Total: 2.2 g/dL (ref 1.5–4.5)
Glucose: 108 mg/dL — ABNORMAL HIGH (ref 70–99)
Potassium: 3.3 mmol/L — ABNORMAL LOW (ref 3.5–5.2)
Sodium: 139 mmol/L (ref 134–144)
Total Protein: 7.1 g/dL (ref 6.0–8.5)
eGFR: 88 mL/min/{1.73_m2} (ref >59)

## 2023-03-22 LAB — LIPID PANEL
Chol/HDL Ratio: 2 {ratio} (ref 0.0–4.4)
Cholesterol, Total: 84 mg/dL — ABNORMAL LOW (ref 100–199)
HDL: 42 mg/dL (ref >39)
LDL Chol Calc (NIH): 24 mg/dL (ref 0–99)
Triglycerides: 90 mg/dL (ref 0–149)
VLDL Cholesterol Cal: 18 mg/dL (ref 5–40)

## 2023-03-22 LAB — TSH+FREE T4
Free T4: 1.3 ng/dL (ref 0.82–1.77)
TSH: 1.34 u[IU]/mL (ref 0.450–4.500)

## 2023-03-22 LAB — VITAMIN D 25 HYDROXY (VIT D DEFICIENCY, FRACTURES): Vit D, 25-Hydroxy: 33.6 ng/mL (ref 30.0–100.0)

## 2023-03-22 LAB — HEMOGLOBIN A1C
Est. average glucose Bld gHb Est-mCnc: 134 mg/dL
Hgb A1c MFr Bld: 6.3 % — ABNORMAL HIGH (ref 4.8–5.6)

## 2023-03-23 ENCOUNTER — Other Ambulatory Visit: Payer: Self-pay | Admitting: Family Medicine

## 2023-03-23 DIAGNOSIS — E785 Hyperlipidemia, unspecified: Secondary | ICD-10-CM

## 2023-03-23 MED ORDER — ROSUVASTATIN CALCIUM 10 MG PO TABS
10.0000 mg | ORAL_TABLET | Freq: Every day | ORAL | 3 refills | Status: DC
Start: 2023-03-23 — End: 2023-09-20

## 2023-03-23 NOTE — Progress Notes (Signed)
Great job on decreasing your hemoglobin A1c from 6.6 to 6.3! I recommend staying on your current treatment regimen while implementing lifestyle changes by reducing your intake of high-sugar foods and beverages and increasing physical activity. Your LDL cholesterol is at goal, and as discussed, I have decreased your rosuvastatin to 10 mg daily; the new prescription has been sent to your pharmacy. Please continue to implement lifestyle changes by decreasing your intake of greasy, starchy, and fatty foods while increasing physical activity. Additionally, your potassium level is slightly low, and I recommend increasing your intake of potassium-rich foods such as bananas, spinach, avocados, and sweet potatoes.

## 2023-03-26 ENCOUNTER — Telehealth: Payer: Self-pay | Admitting: Family Medicine

## 2023-03-26 NOTE — Telephone Encounter (Signed)
Forms faxed to [phone]

## 2023-03-26 NOTE — Telephone Encounter (Signed)
Roxy from special team called [phone] following [place] status

## 2023-03-26 NOTE — Telephone Encounter (Signed)
Roxy from special team called [phone] following up on the  status  on diabetic meter and strips that was fax over to our office, needs to faxed to [phone]
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
## 2023-04-04 ENCOUNTER — Ambulatory Visit (INDEPENDENT_AMBULATORY_CARE_PROVIDER_SITE_OTHER): Payer: 59 | Admitting: Internal Medicine

## 2023-04-04 ENCOUNTER — Encounter: Payer: Self-pay | Admitting: Internal Medicine

## 2023-04-04 VITALS — BP 134/83 | HR 82 | Ht 66.0 in | Wt 173.0 lb

## 2023-04-04 DIAGNOSIS — F17210 Nicotine dependence, cigarettes, uncomplicated: Secondary | ICD-10-CM | POA: Insufficient documentation

## 2023-04-04 DIAGNOSIS — Z87891 Personal history of nicotine dependence: Secondary | ICD-10-CM | POA: Insufficient documentation

## 2023-04-04 DIAGNOSIS — R0609 Other forms of dyspnea: Secondary | ICD-10-CM | POA: Diagnosis not present

## 2023-04-04 NOTE — Patient Instructions (Addendum)
To get the most out of exercise, you need to be continuously aware that you are short of breath, but never out of breath, for at least 30 minutes daily. As you improve, it will actually be easier for you to do the same amount of exercise  in  30 minutes so always push to the level where you are short of breath.     Make sure you check your oxygen saturations at highest level of activity that you can tolerate  Please remember to go to the lab department   for your tests - we will call you with the results when they are available.     The key is to stop smoking completely before smoking completely stops you!    PFTs next available and I will call you with results and do CPST (treadmill /computer) next step if you are not satisfied you are making progress.

## 2023-04-04 NOTE — Assessment & Plan Note (Signed)
Counseled re importance of smoking cessation but did not meet time criteria for separate billing    Rec  pfts  for baseline since has emphysema on CT but no additonal studies or pulmonary f/u if pfts nl   Defer smoking cessation counseling to PCP  Each maintenance medication was reviewed in detail including emphasizing most importantly the difference between maintenance and prns and under what circumstances the prns are to be triggered using an action plan format where appropriate.  Total time for H and P, chart review, counseling,  directly observing portions of ambulatory 02 saturation study/ and generating customized AVS unique to this office visit / same day charting > 60 min for    refractory respiratory  symptoms of uncertain etiology
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
## 2023-04-05 LAB — BRAIN NATRIURETIC PEPTIDE: BNP: 16.6 pg/mL (ref 0.0–100.0)

## 2023-04-05 LAB — D-DIMER, QUANTITATIVE: D-DIMER: 0.49 mg{FEU}/L (ref 0.00–0.49)

## 2023-04-09 LAB — ALPHA-1-ANTITRYPSIN PHENOTYP: A-1 Antitrypsin: 155 mg/dL (ref 101–187)

## 2023-04-09 LAB — IGE: IgE (Immunoglobulin E), Serum: 72 [IU]/mL (ref 6–495)

## 2023-04-16 ENCOUNTER — Encounter (HOSPITAL_BASED_OUTPATIENT_CLINIC_OR_DEPARTMENT_OTHER): Payer: 59

## 2023-04-26 ENCOUNTER — Encounter (HOSPITAL_BASED_OUTPATIENT_CLINIC_OR_DEPARTMENT_OTHER): Payer: 59

## 2023-05-03 ENCOUNTER — Ambulatory Visit (INDEPENDENT_AMBULATORY_CARE_PROVIDER_SITE_OTHER): Payer: 59 | Admitting: Internal Medicine

## 2023-05-03 DIAGNOSIS — R0609 Other forms of dyspnea: Secondary | ICD-10-CM

## 2023-05-03 LAB — PULMONARY FUNCTION TEST
DL/VA % pred: 97 %
DL/VA: 4.14 ml/min/mmHg/L
DLCO cor % pred: 84 %
DLCO cor: 18.81 ml/min/mmHg
DLCO unc % pred: 82 %
DLCO unc: 18.51 ml/min/mmHg
FEF 25-75 Post: 4.31 L/s
FEF 25-75 Pre: 3.83 L/s
FEF2575-%Change-Post: 12 %
FEF2575-%Pred-Post: 150 %
FEF2575-%Pred-Pre: 133 %
FEV1-%Change-Post: 5 %
FEV1-%Pred-Post: 99 %
FEV1-%Pred-Pre: 94 %
FEV1-Post: 2.97 L
FEV1-Pre: 2.81 L
FEV1FVC-%Change-Post: 0 %
FEV1FVC-%Pred-Pre: 108 %
FEV6-%Change-Post: 5 %
FEV6-%Pred-Post: 93 %
FEV6-%Pred-Pre: 88 %
FEV6-Post: 3.41 L
FEV6-Pre: 3.23 L
FEV6FVC-%Pred-Post: 102 %
FEV6FVC-%Pred-Pre: 102 %
FVC-%Change-Post: 5 %
FVC-%Pred-Post: 90 %
FVC-%Pred-Pre: 86 %
FVC-Post: 3.41 L
FVC-Pre: 3.23 L
Post FEV1/FVC ratio: 87 %
Post FEV6/FVC ratio: 100 %
Pre FEV1/FVC ratio: 87 %
Pre FEV6/FVC Ratio: 100 %
RV % pred: 84 %
RV: 1.58 L
TLC % pred: 93 %
TLC: 4.99 L

## 2023-05-03 NOTE — Patient Instructions (Signed)
Full PFT Performed Today  

## 2023-05-03 NOTE — Progress Notes (Signed)
Full PFT Performed Today  

## 2023-05-15 ENCOUNTER — Ambulatory Visit: Payer: 59 | Admitting: Obstetrics & Gynecology

## 2023-05-17 ENCOUNTER — Encounter: Payer: Self-pay | Admitting: Obstetrics & Gynecology

## 2023-05-17 ENCOUNTER — Ambulatory Visit (INDEPENDENT_AMBULATORY_CARE_PROVIDER_SITE_OTHER): Payer: 59 | Admitting: Obstetrics & Gynecology

## 2023-05-17 VITALS — BP 127/86 | HR 79 | Ht 66.0 in | Wt 172.0 lb

## 2023-05-17 DIAGNOSIS — N924 Excessive bleeding in the premenopausal period: Secondary | ICD-10-CM

## 2023-05-17 DIAGNOSIS — N951 Menopausal and female climacteric states: Secondary | ICD-10-CM

## 2023-05-17 NOTE — Progress Notes (Signed)
Follow up appointment for results: Megestrol response  Chief Complaint  Patient presents with   Follow-up    Blood pressure 127/86, pulse 79, height 5\' 6"  (1.676 m), weight 172 lb (78 kg), last menstrual period 05/14/2023.                                                                                                                                   GYNECOLOGIC SONOGRAM     Brittney Reyes is a 52 y.o. No obstetric history on file. No LMP recorded. (Menstrual status: Perimenopausal). for a pelvic sonogram for DUB with clots and fibroids.    LMP July 2024 with continuous bleeding.   Uterus                      8.9 x 9.1 x 7.1 cm, Total uterine volume 303 cc    Retroverted Ut with multiple small intramural fibroids = 16 x 16 mm, 18 x 13 mm, 17 x 13 mm   Endometrium          23 mm, symmetrical thickened endometrial cavity,  no definite intracavitary mass seen   Right ovary             2.9 x 1.9 x 1.3 cm, mobile   Left ovary                3.1 x 1.9 x 2.4 cm, mobile   Neg CDS - No FF   Technician Comments:   TA and TV U/S performed    Tammy - Chaperone Retroverted enlarged ut with several intramural fibroids <= 17 mm Heterogenous myometrium  Thickened em cavity without definite focal mass ? Endometrial hypertrophy Em thickness = 23 mm Normal ov's  -  neg adn regions  -  neg CDS,  no Free Fluid       Wylie Hail 03/19/2023 12:03 PM   Clinical Impression and recommendations:   I have reviewed the sonogram results above, combined with the patient's current clinical course, below are my impressions and any appropriate recommendations for management based on the sonographic findings.   Uterus enlarged 303 cc, small fibroids Endometrium 23 mm, homogenous on megestrol therapy  Ovaries: normal size shape and morphology     Lazaro Arms 03/27/2023 11:42 AM  MEDS ordered this encounter: No orders of the defined types were placed in this encounter.   Orders for this  encounter: No orders of the defined types were placed in this encounter.   Impression + Management Plan   ICD-10-CM   1. Perimenopausal menorrhagia: Bleeding managed on megestrol 24 on/4 off  N92.4     2. Perimenopausal vasomotor symptoms  N95.1    if bleeding is still acceptable will consider duavee for management      Follow Up: No follow-ups on file.     All questions were answered.  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     Allergies  Allergen Reactions   Duloxetine Hives   Fluoxetine Nausea And Vomiting   Lactose Intolerance (Gi) Cough, Diarrhea, Hives, Itching, Palpitations, Rash, Shortness Of Breath and Swelling   Milk (Cow) Hives   Gabapentin Other (See Comments)    Dizziness and slurring of words on this medication   Prozac [Fluoxetine Hcl] Nausea And Vomiting and Swelling   Wellbutrin [Bupropion] Hives   Gluten Meal Itching    Social History   Socioeconomic History   Marital status: Married    Spouse name: Not on file   Number of children: 6   Years of education: Not on file   Highest education level: Associate degree: occupational, Scientist, product/process development, or vocational program  Occupational History   Not on file  Tobacco Use   Smoking status: Every Day    Current packs/day: 1.00    Average packs/day: 1 pack/day for 36.3 years (36.3 ttl pk-yrs)    Types: Cigarettes    Start date:  01/30/1987   Smokeless tobacco: Never   Tobacco comments:    Quit   Vaping Use   Vaping status: Never Used  Substance and Sexual Activity   Alcohol use: No   Drug use: No   Sexual activity: Yes    Birth control/protection: Surgical    Comment: Tubal  Other Topics Concern   Not on file  Social History Narrative   Not on file   Social Determinants of Health   Financial Resource Strain: Medium Risk (02/13/2023)   Overall Financial Resource Strain (CARDIA)    Difficulty of Paying Living Expenses: Somewhat hard  Food Insecurity: No Food Insecurity (02/13/2023)   Hunger Vital Sign    Worried About Running Out of Food in the Last Year: Never true    Ran Out of Food in the Last Year: Never true  Transportation Needs: No Transportation Needs (02/13/2023)   PRAPARE - Administrator, Civil Service (Medical): No    Lack of Transportation (Non-Medical): No  Physical Activity: Insufficiently Active (02/13/2023)   Exercise Vital Sign    Days of Exercise per Week: 3 days    Minutes of Exercise per Session: 20 min  Stress: Stress Concern Present (02/13/2023)   [name] of Occupational Health - Occupational Stress Questionnaire    Feeling of Stress : Rather much  Social Connections: Socially Integrated (02/13/2023)   Social Connection and Isolation Panel [NHANES]    Frequency of Communication with Friends and Family: Once a week    Frequency of Social Gatherings with Friends and Family: More than three times a week    Attends Religious Services: More than 4 times per year    Active Member of [employer] or Organizations: Yes    Attends Banker Meetings: More than 4 times per  year    Marital Status: Married    Family History  Problem Relation Age of Onset   Diabetes Mother    Hypertension Mother    Hypertension Father    Migraines Daughter    Asthma Child    Colon cancer Neg Hx    Colon polyps Neg Hx

## 2023-05-27 ENCOUNTER — Other Ambulatory Visit: Payer: Self-pay | Admitting: Family Medicine

## 2023-05-27 DIAGNOSIS — I10 Essential (primary) hypertension: Secondary | ICD-10-CM

## 2023-07-17 LAB — HM DIABETES EYE EXAM

## 2023-07-24 ENCOUNTER — Ambulatory Visit: Payer: 59 | Admitting: Family Medicine

## 2023-08-02 ENCOUNTER — Ambulatory Visit: Payer: Self-pay | Admitting: Family Medicine

## 2023-08-15 ENCOUNTER — Other Ambulatory Visit: Payer: Self-pay | Admitting: Family Medicine

## 2023-08-15 DIAGNOSIS — M62830 Muscle spasm of back: Secondary | ICD-10-CM

## 2023-08-22 ENCOUNTER — Ambulatory Visit: Payer: 59 | Attending: Internal Medicine | Admitting: Internal Medicine

## 2023-08-22 ENCOUNTER — Encounter: Payer: Self-pay | Admitting: Internal Medicine

## 2023-08-22 VITALS — BP 142/88 | HR 77 | Ht 66.0 in | Wt 172.6 lb

## 2023-08-22 DIAGNOSIS — I10 Essential (primary) hypertension: Secondary | ICD-10-CM | POA: Diagnosis not present

## 2023-08-22 DIAGNOSIS — H9393 Unspecified disorder of ear, bilateral: Secondary | ICD-10-CM | POA: Insufficient documentation

## 2023-08-22 DIAGNOSIS — R0789 Other chest pain: Secondary | ICD-10-CM | POA: Insufficient documentation

## 2023-08-22 NOTE — Progress Notes (Signed)
 Cardiology Office Note  Date: 08/22/2023   ID: Brittney Reyes, DOB 10/30/1970, MRN [id number]  PCP:  Gilmore Laroche, FNP  Cardiologist:  Marjo Bicker, MD Electrophysiologist:  None   History of Present Illness: Brittney Reyes is a 53 y.o. female known to have HTN is here for follow-up visit  She was being followed by cardiology at [facility], last visit was in 2020.  I reviewed the event monitor from 2018 which showed 1 run of brief SVT lasting 7 beats but otherwise unremarkable.  Her symptoms correlated with NSR and PVC given the visit burden was less than 1%.  Echocardiogram, NM stress test were within normal limits.  Initially referred to cardiology clinic to establish care.  Records stated above.  She reported having dizziness which was vertigo, improved partially after starting meclizine but she still has hearing impairment and ear fullness.  She will need to be evaluated by PCP and if required, ENT referral.  She does have caregiver fatigue where she takes care of her mother who is morbidly obese, paralyzed and bedridden.  She feeds her, changes her clothes, bathes her etc.  She does feel tired and short of breath during these activities.  She had chest pains, sharp in quality, lasting for 1 to 2 seconds, 2 times in the last 6 months.  No other symptoms of syncope, leg swelling.  She also gets palpitations only with severe exertion but no palpitations with rest.  Severe exertion includes climbing stairs, lifting/carrying heavy weights etc.  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     cyclobenzaprine (FLEXERIL) 5 MG tablet TAKE ONE TABLET BY MOUTH AT BEDTIME 30 tablet 1   meclizine (ANTIVERT) 25 MG tablet Take 1 tablet (25 mg total) by mouth 3 (three) times daily. 270 tablet 3   megestrol (MEGACE) 40 MG tablet 3 tablets a day for 5 days, 2 tablets a day for 5 days then 1 tablet daily 45 tablet 0   metFORMIN (GLUCOPHAGE) 500 MG tablet Take 2 tablets (1,000 mg total) by mouth daily. 180 tablet 3   metoprolol succinate (TOPROL-XL) 25 MG 24 hr tablet TAKE ONE TABLET BY MOUTH EVERY DAY 90 tablet 1   omeprazole (PRILOSEC) 20 MG capsule Take 20 mg by mouth daily.     rosuvastatin (CRESTOR) 10 MG tablet Take 1 tablet (10 mg total) by mouth daily. 90 tablet 3   triamterene-hydrochlorothiazide (MAXZIDE-25) 37.5-25 MG tablet TAKE ONE TABLET BY MOUTH EVERY DAY 90 tablet 2   No current facility-administered medications for this visit.   Allergies:  Duloxetine, Fluoxetine, Lactose intolerance (gi), Milk (cow), Gabapentin, Prozac [fluoxetine hcl], Wellbutrin [bupropion], and Gluten meal   Social History:  The patient  reports that she has been smoking cigarettes. She started smoking about 36 years ago. She has a 36.6 pack-year smoking history. She has never used smokeless tobacco. She reports that she does not drink alcohol and does not use drugs.   Family History: The patient's family history includes Asthma in her child; Diabetes in her mother; Hypertension in her father and mother; Migraines in her daughter.   ROS:  Please see the history of present illness. Otherwise, complete review of systems is positive for none.  All other systems are reviewed and negative.   Physical Exam: VS:  Ht 5\' 6"   (1.676 m)   Wt 172 lb 9.6 oz (78.3 kg)   BMI 27.86 kg/m , BMI Body mass index is 27.86 kg/m.  Wt Readings from Last 3 Encounters:  08/22/23 172 lb 9.6 oz (78.3 kg)  05/17/23 172 lb (78 kg)  05/03/23 171 lb 6.4 oz (77.7 kg)    General: Patient appears comfortable at rest. HEENT: Conjunctiva and lids normal, oropharynx clear with moist mucosa. Neck: Supple, no elevated JVP or carotid bruits, no thyromegaly. Lungs: Clear to auscultation, nonlabored breathing at rest. Cardiac: Regular rate and rhythm, no S3 or significant systolic murmur, no pericardial rub. Abdomen: Soft, nontender, no hepatomegaly, bowel sounds present, no guarding or rebound. Extremities: No pitting edema, distal pulses 2+. Skin: Warm and dry. Musculoskeletal: No kyphosis. Neuropsychiatric: Alert and oriented x3, affect grossly appropriate.  Recent Labwork: 03/20/2023: ALT 22; AST 20; BUN 8; Creatinine, Ser 0.81; Hemoglobin 12.9; Platelets 353; Potassium 3.3; Sodium 139; TSH 1.340 04/04/2023: BNP 16.6     Component Value Date/Time   CHOL 84 (L) 03/20/2023 1132   TRIG 90 03/20/2023 1132   HDL 42 03/20/2023 1132   CHOLHDL 2.0 03/20/2023 1132   LDLCALC 24 03/20/2023 1132     Assessment and Plan:  Ear disorder: She has symptoms of vertigo, ear fullness and hearing impairment.  Follow-up with PCP.  Meclizine partially improved her symptoms but not completely.  Palpitations: Only with severe exertion like climbing stairs, carrying/lifting heavy weights etc.  No palpitations at rest.  Event monitor from 2018 was unremarkable.  No need of repeat event monitor.  Noncardiac chest pain: Sharp chest pains lasting for 1 to 2 seconds at rest and occurred 2 times in the last 6 months.  No ischemia evaluation indicated at this time.  Caregiver fatigue: Patient takes care of her sick stepmother who is morbidly obese, paralyzed bedridden.  Patient helps change her clothes, feed her, bathe her etc.  She does not exhausted and  short of breath taking care of her mother and father, running around between these 2 houses every day. Follow-up with PCP.  HTN, controlled: Continue current antihypertensives, triamterene-HCTZ 37.5-25 mg once daily and metoprolol succinate 25 mg once daily.  Follows with PCP.  HLD, at goal: LDL 24 around 5 months ago.  Goal LDL 100.  Currently on rosuvastatin 10 mg nightly.  It can be cut back to 5 mg nightly as LDL is too low.  Can follow-up with PCP.    Medication Adjustments/Labs and Tests Ordered: Current medicines are reviewed at length with the patient today.  Concerns regarding medicines are outlined above.    Disposition:  Follow up as needed  Signed, Nathalie Cavendish Verne Spurr, MD, 08/22/2023 9:37 AM    [facility] [address] [place], [place] [address]

## 2023-08-22 NOTE — Patient Instructions (Signed)
Medication Instructions:  Your physician recommends that you continue on your current medications as directed. Please refer to the Current Medication list given to you today.  *If you need a refill on your cardiac medications before your next appointment, please call your pharmacy*   Lab Work: None If you have labs (blood work) drawn today and your tests are completely normal, you will receive your results only by: MyChart Message (if you have MyChart) OR A paper copy in the mail If you have any lab test that is abnormal or we need to change your treatment, we will call you to review the results.   Testing/Procedures: None   Follow-Up: At [facility], you and your health needs are our priority.  As part of our continuing mission to provide you with exceptional heart care, we have created designated Provider Care Teams.  These Care Teams include your primary Cardiologist (physician) and Advanced Practice Providers (APPs -  Physician Assistants and Nurse Practitioners) who all work together to provide you with the care you need, when you need it.  We recommend signing up for the patient portal called "MyChart".  Sign up information is provided on this After Visit Summary.  MyChart is used to connect with patients for Virtual Visits (Telemedicine).  Patients are able to view lab/test results, encounter notes, upcoming appointments, etc.  Non-urgent messages can be sent to your provider as well.   To learn more about what you can do with MyChart, go to [email or web address].    Your next appointment:    Follow up as needed.   Provider:   You may see Vishnu P Mallipeddi, MD or one of the following Advanced Practice Providers on your designated Care Team:   [place], PA-C  Jacolyn Reedy, [place]     Other Instructions

## 2023-08-24 ENCOUNTER — Encounter: Payer: Self-pay | Admitting: Obstetrics & Gynecology

## 2023-08-24 ENCOUNTER — Ambulatory Visit: Admitting: Obstetrics & Gynecology

## 2023-08-24 VITALS — BP 142/90 | HR 91 | Ht 66.0 in | Wt 170.0 lb

## 2023-08-24 DIAGNOSIS — N938 Other specified abnormal uterine and vaginal bleeding: Secondary | ICD-10-CM | POA: Diagnosis not present

## 2023-08-24 DIAGNOSIS — N951 Menopausal and female climacteric states: Secondary | ICD-10-CM

## 2023-08-24 DIAGNOSIS — D219 Benign neoplasm of connective and other soft tissue, unspecified: Secondary | ICD-10-CM

## 2023-08-24 MED ORDER — MYFEMBREE 40-1-0.5 MG PO TABS: 1.0000 | ORAL_TABLET | Freq: Every day | ORAL | 11 refills | Status: AC

## 2023-08-24 NOTE — Progress Notes (Signed)
 Follow up appointment  Megestrol response  Chief Complaint  Patient presents with   Follow-up    Blood pressure (!) 142/90, pulse 91, height 5\' 6"  (1.676 m), weight 170 lb (77.1 kg).  Pt is having variable response to megestrol some months ok some months bleeds/spotts the whole month Does have fibroids, 303 cc, see 10/24 sonogram    MEDS ordered this encounter: Meds ordered this encounter  Medications   Relugolix-Estradiol-Norethind (MYFEMBREE) 40-1-0.5 MG TABS    Sig: Take 1 tablet by mouth daily.    Dispense:  30 tablet    Refill:  11    Orders for this encounter: No orders of the defined types were placed in this encounter.   Impression + Management Plan   ICD-10-CM   1. Fibroids, 303 cc, trial myfembree  D21.9     2. DUB (dysfunctional uterine bleeding)  N93.8     3. Perimenopausal vasomotor symptoms  N95.1       Follow Up: Return in about 4 months (around 12/24/2023) for Follow up, with Dr Despina Hidden.     All questions were answered.  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     Allergies  Allergen Reactions   Duloxetine Hives   Fluoxetine Nausea And Vomiting   Lactose Intolerance (Gi) Cough, Diarrhea, Hives, Itching, Palpitations, Rash, Shortness Of Breath and Swelling   Milk (Cow) Hives   Gabapentin Other (See Comments)    Dizziness and slurring of words on this medication   Prozac  [Fluoxetine Hcl] Nausea And Vomiting and Swelling   Wellbutrin [Bupropion] Hives   Gluten Meal Itching    Social History   Socioeconomic History   Marital status: Married    Spouse name: Not on file   Number of children: 6   Years of education: Not on file   Highest education level: Associate degree: occupational, Scientist, product/process development, or vocational program  Occupational History   Not on file  Tobacco Use   Smoking status: Every Day    Current packs/day: 1.00    Average packs/day: 1 pack/day for 36.6 years (36.6 ttl pk-yrs)    Types: Cigarettes    Start date: 01/30/1987   Smokeless tobacco: Never   Tobacco comments:    Quit   Vaping Use   Vaping status: Never Used  Substance and Sexual Activity   Alcohol use: No   Drug use: No   Sexual activity: Yes    Birth control/protection: Surgical    Comment: Tubal  Other Topics Concern   Not on file  Social History Narrative   Not on file   Social Drivers of Health   Financial Resource Strain: Medium Risk (02/13/2023)   Overall Financial Resource Strain (CARDIA)    Difficulty of Paying Living Expenses: Somewhat hard  Food Insecurity: No Food  Insecurity (02/13/2023)   Hunger Vital Sign    Worried About Running Out of Food in the Last Year: Never true    Ran Out of Food in the Last Year: Never true  Transportation Needs: No Transportation Needs (02/13/2023)   PRAPARE - Administrator, Civil Service (Medical): No    Lack of Transportation (Non-Medical): No  Physical Activity: Insufficiently Active (02/13/2023)   Exercise Vital Sign    Days of Exercise per Week: 3 days    Minutes of Exercise per Session: 20 min  Stress: Stress Concern Present (02/13/2023)   [name] of Occupational Health - Occupational Stress Questionnaire    Feeling of Stress : Rather much  Social Connections: Socially Integrated (02/13/2023)   Social Connection and Isolation Panel [NHANES]    Frequency of Communication with Friends and Family: Once a week     Frequency of Social Gatherings with Friends and Family: More than three times a week    Attends Religious Services: More than 4 times per year    Active Member of [employer] or Organizations: Yes    Attends Engineer, structural: More than 4 times per year    Marital Status: Married    Family History  Problem Relation Age of Onset   Diabetes Mother    Hypertension Mother    Hypertension Father    Migraines Daughter    Asthma Child    Colon cancer Neg Hx    Colon polyps Neg Hx

## 2023-08-28 ENCOUNTER — Encounter: Payer: Self-pay | Admitting: *Deleted

## 2023-08-30 ENCOUNTER — Other Ambulatory Visit: Payer: Self-pay | Admitting: Family Medicine

## 2023-08-30 DIAGNOSIS — I10 Essential (primary) hypertension: Secondary | ICD-10-CM
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
## 2023-09-20 ENCOUNTER — Encounter: Payer: Self-pay | Admitting: Internal Medicine

## 2023-09-20 ENCOUNTER — Encounter: Payer: Self-pay | Admitting: Family Medicine

## 2023-09-20 ENCOUNTER — Ambulatory Visit: Payer: Self-pay | Admitting: Family Medicine

## 2023-09-20 ENCOUNTER — Ambulatory Visit (HOSPITAL_COMMUNITY)
Admission: RE | Admit: 2023-09-20 | Discharge: 2023-09-20 | Disposition: A | Discharge status: Home / Self Care | Source: Ambulatory Visit | Attending: Family Medicine | Admitting: Family Medicine

## 2023-09-20 ENCOUNTER — Ambulatory Visit: Admitting: Internal Medicine

## 2023-09-20 VITALS — BP 142/95 | HR 89 | Ht 66.0 in | Wt 173.8 lb

## 2023-09-20 VITALS — BP 142/95 | HR 89 | Resp 16 | Ht 66.0 in | Wt 173.8 lb

## 2023-09-20 DIAGNOSIS — K219 Gastro-esophageal reflux disease without esophagitis: Secondary | ICD-10-CM

## 2023-09-20 DIAGNOSIS — F419 Anxiety disorder, unspecified: Secondary | ICD-10-CM

## 2023-09-20 DIAGNOSIS — E1159 Type 2 diabetes mellitus with other circulatory complications: Secondary | ICD-10-CM | POA: Diagnosis not present

## 2023-09-20 DIAGNOSIS — J301 Allergic rhinitis due to pollen: Secondary | ICD-10-CM

## 2023-09-20 DIAGNOSIS — E559 Vitamin D deficiency, unspecified: Secondary | ICD-10-CM

## 2023-09-20 DIAGNOSIS — Z794 Long term (current) use of insulin: Secondary | ICD-10-CM | POA: Diagnosis not present

## 2023-09-20 DIAGNOSIS — M62830 Muscle spasm of back: Secondary | ICD-10-CM

## 2023-09-20 DIAGNOSIS — I10 Essential (primary) hypertension: Secondary | ICD-10-CM

## 2023-09-20 DIAGNOSIS — E7849 Other hyperlipidemia: Secondary | ICD-10-CM

## 2023-09-20 DIAGNOSIS — R7301 Impaired fasting glucose: Secondary | ICD-10-CM

## 2023-09-20 DIAGNOSIS — R0609 Other forms of dyspnea: Secondary | ICD-10-CM | POA: Diagnosis not present

## 2023-09-20 DIAGNOSIS — E785 Hyperlipidemia, unspecified: Secondary | ICD-10-CM

## 2023-09-20 DIAGNOSIS — F17210 Nicotine dependence, cigarettes, uncomplicated: Secondary | ICD-10-CM

## 2023-09-20 DIAGNOSIS — E119 Type 2 diabetes mellitus without complications: Secondary | ICD-10-CM

## 2023-09-20 DIAGNOSIS — E038 Other specified hypothyroidism: Secondary | ICD-10-CM

## 2023-09-20 DIAGNOSIS — Z23 Encounter for immunization: Secondary | ICD-10-CM

## 2023-09-20 DIAGNOSIS — Z982 Presence of cerebrospinal fluid drainage device: Secondary | ICD-10-CM | POA: Diagnosis not present

## 2023-09-20 DIAGNOSIS — M542 Cervicalgia: Secondary | ICD-10-CM | POA: Insufficient documentation

## 2023-09-20 MED ORDER — TRIAMTERENE-HCTZ 37.5-25 MG PO TABS: 1.0000 | ORAL_TABLET | Freq: Every day | ORAL | 2 refills | Status: AC

## 2023-09-20 MED ORDER — ROSUVASTATIN CALCIUM 10 MG PO TABS: 10.0000 mg | ORAL_TABLET | Freq: Every day | ORAL | 3 refills | Status: DC

## 2023-09-20 MED ORDER — CYCLOBENZAPRINE HCL 5 MG PO TABS: 5.0000 mg | ORAL_TABLET | Freq: Every day | ORAL | 1 refills | Status: AC

## 2023-09-20 MED ORDER — PAROXETINE HCL 10 MG PO TABS
10.0000 mg | ORAL_TABLET | Freq: Every day | ORAL | 1 refills | Status: AC
Start: 2023-09-20 — End: ?

## 2023-09-20 MED ORDER — LEVOCETIRIZINE DIHYDROCHLORIDE 5 MG PO TABS: 5.0000 mg | ORAL_TABLET | Freq: Every evening | ORAL | 1 refills | Status: DC

## 2023-09-20 MED ORDER — OMEPRAZOLE 20 MG PO CPDR: 20.0000 mg | DELAYED_RELEASE_CAPSULE | Freq: Every day | ORAL | 1 refills | Status: DC

## 2023-09-20 MED ORDER — METOPROLOL SUCCINATE ER 25 MG PO TB24: 25.0000 mg | ORAL_TABLET | Freq: Every day | ORAL | 1 refills | Status: AC

## 2023-09-20 MED ORDER — FLUTICASONE PROPIONATE 50 MCG/ACT NA SUSP: 2.0000 | Freq: Every day | NASAL | 6 refills | Status: AC

## 2023-09-20 MED ORDER — METFORMIN HCL 500 MG PO TABS: 1000.0000 mg | ORAL_TABLET | Freq: Every day | ORAL | 3 refills | Status: AC

## 2023-09-20 NOTE — Patient Instructions (Addendum)
 I appreciate the opportunity to provide care to you today!    Follow up:  4 months  Labs: please stop by the lab today to get your blood drawn (CBC, CMP, TSH, Lipid profile, HgA1c, Vit D)   Musculoskeletal Neck Pain: Continue muscle relaxant as previously prescribed if it is providing relief. Add NSAIDs (e.g., ibuprofen 400-600 mg every 6-8 hours as needed) to help with pain and inflammation. Apply warm compresses to the affected area to ease muscle tension. Encourage gentle stretching exercises as tolerated.  Shunt Evaluation: Please stop by [facility] for a neck X-ray to evaluate shunt placement and rule out any mechanical issues related to the fall.  Right Ear Pain and Pressure: Start taking Xyzal 5 mg at bedtime for allergy-related symptoms. Begin using a nasal corticosteroid spray (e.g., fluticasone) once daily to relieve ear pressure, likely due to Eustachian tube dysfunction from congestion.  Anxiety: Start Paxil (paroxetine) 10 mg by mouth once daily. Nonpharmacologic management includes: Mindfulness techniques and deep breathing exercises Regular physical activity Sleep hygiene and limiting caffeine/alcohol intake    Attached with your AVS, you will find valuable resources for self-education. I highly recommend dedicating some time to thoroughly examine them.   Please continue to a heart-healthy diet and increase your physical activities. Try to exercise for at least five days a week.    It was a pleasure to see you and I look forward to continuing to work together on your health and well-being. Please do not hesitate to call the office if you need care or have questions about your care.  In case of emergency, please visit the Emergency Department for urgent care, or contact our clinic at [phone] to schedule an appointment. We're here to help you!   Have a wonderful day and week. With Gratitude, Gilmore Laroche MSN, FNP-BC

## 2023-09-20 NOTE — Patient Instructions (Addendum)
 To get the most out of exercise, you need to be continuously aware that you are short of breath, but never out of breath, for at least 30 minutes daily. As you improve, it will actually be easier for you to do the same amount of exercise  in  30 minutes so always push to the level where you are short of breath.    Make sure you check your oxygen saturation at your highest level of activity(NOT after you stop)  to be sure it stays over 90% and keep track of it at least once a week, more often if breathing getting worse, and let me know if losing ground. (Collect the dots to connect the dots approach)      Call if not better after 6 weeks and I'll try to arrange a CPST in [place].    The key is to stop smoking completely before smoking completely stops you - it's not too late    Follow up here is as needed
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
## 2023-09-21 LAB — CBC WITH DIFFERENTIAL/PLATELET
Basophils Absolute: 0.1 10*3/uL (ref 0.0–0.2)
Basos: 1 %
EOS (ABSOLUTE): 0.1 10*3/uL (ref 0.0–0.4)
Eos: 1 %
Hematocrit: 36.5 % (ref 34.0–46.6)
Hemoglobin: 12.3 g/dL (ref 11.1–15.9)
Immature Grans (Abs): 0 10*3/uL (ref 0.0–0.1)
Immature Granulocytes: 0 %
Lymphocytes Absolute: 2.8 10*3/uL (ref 0.7–3.1)
Lymphs: 32 %
MCH: 28.9 pg (ref 26.6–33.0)
MCHC: 33.7 g/dL (ref 31.5–35.7)
MCV: 86 fL (ref 79–97)
Monocytes Absolute: 0.5 10*3/uL (ref 0.1–0.9)
Monocytes: 6 %
Neutrophils Absolute: 5.2 10*3/uL (ref 1.4–7.0)
Neutrophils: 60 %
Platelets: 393 10*3/uL (ref 150–450)
RBC: 4.26 x10E6/uL (ref 3.77–5.28)
RDW: 13.8 % (ref 11.7–15.4)
WBC: 8.6 10*3/uL (ref 3.4–10.8)

## 2023-09-21 LAB — CMP14+EGFR
ALT: 14 IU/L (ref 0–32)
AST: 13 IU/L (ref 0–40)
Albumin: 4.5 g/dL (ref 3.9–4.9)
Alkaline Phosphatase: 69 IU/L (ref 44–121)
BUN/Creatinine Ratio: 17 (ref 9–23)
BUN: 13 mg/dL (ref 6–24)
Bilirubin Total: <0.2 mg/dL (ref 0.0–1.2)
CO2: 20 mmol/L (ref 20–29)
Calcium: 9.6 mg/dL (ref 8.7–10.2)
Chloride: 99 mmol/L (ref 96–106)
Creatinine, Ser: 0.78 mg/dL (ref 0.57–1.00)
Globulin, Total: 2.5 g/dL (ref 1.5–4.5)
Glucose: 138 mg/dL — ABNORMAL HIGH (ref 70–99)
Potassium: 4 mmol/L (ref 3.5–5.2)
Sodium: 136 mmol/L (ref 134–144)
Total Protein: 7 g/dL (ref 6.0–8.5)
eGFR: 92 mL/min/{1.73_m2} (ref >59)

## 2023-09-21 LAB — LIPID PANEL
Chol/HDL Ratio: 2.7 ratio (ref 0.0–4.4)
Cholesterol, Total: 113 mg/dL (ref 100–199)
HDL: 42 mg/dL (ref >39)
LDL Chol Calc (NIH): 49 mg/dL (ref 0–99)
Triglycerides: 126 mg/dL (ref 0–149)
VLDL Cholesterol Cal: 22 mg/dL (ref 5–40)

## 2023-09-21 LAB — TSH+FREE T4
Free T4: 1.03 ng/dL (ref 0.82–1.77)
TSH: 2.42 u[IU]/mL (ref 0.450–4.500)

## 2023-09-21 LAB — HEMOGLOBIN A1C
Est. average glucose Bld gHb Est-mCnc: 160 mg/dL
Hgb A1c MFr Bld: 7.2 % — ABNORMAL HIGH (ref 4.8–5.6)

## 2023-09-21 LAB — VITAMIN D 25 HYDROXY (VIT D DEFICIENCY, FRACTURES): Vit D, 25-Hydroxy: 25.1 ng/mL — ABNORMAL LOW (ref 30.0–100.0)

## 2023-09-22 DIAGNOSIS — Z23 Encounter for immunization: Secondary | ICD-10-CM | POA: Insufficient documentation

## 2023-09-22 DIAGNOSIS — M542 Cervicalgia: Secondary | ICD-10-CM | POA: Insufficient documentation

## 2023-09-22 NOTE — Assessment & Plan Note (Signed)
    09/20/2023    8:39 AM 03/20/2023   10:12 AM 02/13/2023   10:01 AM 12/18/2022   10:26 AM  GAD 7 : Generalized Anxiety Score  Nervous, Anxious, on Edge 3 1 1  0  Control/stop worrying 3 0 0 0  Worry too much - different things 1 0 0 0  Trouble relaxing 3 1 1  0  Restless 0 0 0 0  Easily annoyed or irritable 1 0 1 0  Afraid - awful might happen 0 0 0 0  Total GAD 7 Score 11 2 3  0  Anxiety Difficulty Somewhat difficult Somewhat difficult  Not difficult at all   Encouraged to start taking Paxil (paroxetine) 10 mg by mouth once daily. Nonpharmacologic management includes: Mindfulness techniques and deep breathing exercises Regular physical activity Sleep hygiene and limiting caffeine/alcohol intake

## 2023-09-22 NOTE — Assessment & Plan Note (Signed)
 Patient educated on CDC recommendation for the vaccine. Verbal consent was obtained from the patient, vaccine administered by nurse, no sign of adverse reactions noted at this time. Patient education on arm soreness and use of tylenol or ibuprofen for this patient  was discussed. Patient educated on the signs and symptoms of adverse effect and advise to contact the office if they occur.

## 2023-09-22 NOTE — Assessment & Plan Note (Addendum)
 Right Ear Pain and Pressure likely due to allergies Encouraged to start taking Xyzal 5 mg at bedtime for allergy-related symptoms. Encouraged to begin using a nasal corticosteroid spray (e.g., fluticasone) once daily to relieve ear pressure, likely due to Eustachian tube dysfunction from congestion.

## 2023-09-22 NOTE — Assessment & Plan Note (Signed)
 Uncontrolled blood pressure in the clinic today. She reports forgetting to take Metoprolol 25 mg daily and Maxzide 25 mg daily. She denies headaches, dizziness, and chest pain. Medication compliance and adherence were encouraged. No changes were made to the treatment regimen today. A low-sodium diet and increased physical activity were encouraged. BP Readings from Last 3 Encounters:  09/20/23 (!) 142/95  09/20/23 (!) 142/95  08/24/23 (!) 142/90

## 2023-09-22 NOTE — Assessment & Plan Note (Signed)
 Encouraged to continue muscle relaxant as previously prescribed  Encouraged to add NSAIDs (e.g., ibuprofen 400-600 mg every 6-8 hours as needed) to help with pain and inflammation. Encouraged  to apply warm compresses to the affected area to ease muscle tension. Encouraged gentle stretching exercises as tolerated.  Shunt Evaluation: Encouraged to stop by [facility] for a neck X-ray to evaluate shunt placement and rule out any mechanical issues related to the fall.

## 2023-09-22 NOTE — Assessment & Plan Note (Signed)
 Lab Results  Component Value Date   HGBA1C 7.2 (H) 09/20/2023  She takes metformin 1000 mg daily No reports of polyuria, polyphagia, polydipsia Encouraged decreasing her intake of high sugar foods and beverages with increased risk activity Encouraged to continue treatment regimen
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
## 2023-09-23 NOTE — Assessment & Plan Note (Signed)
 4-5 min discussion re active cigarette smoking in addition to office E&M  Ask about tobacco use:   ongoing  Advise quitting     I reviewed the Fletcher curve with the patient that basically indicates that  if you quit smoking when your best day FEV1 is still well preserved (as is clearly   the case here)  it is highly unlikely you will progress to severe disease and informed the patient there was  no medication on the market that has proven to alter the curve/ its downward trajectory  or the likelihood of progression of their disease(unlike other chronic medical conditions such as atheroclerosis where we do think we can change the natural hx with risk reducing meds)    Therefore stopping smoking and maintaining abstinence are  the most important aspects of her care, not choice of inhalers or for that matter, Pulmonary doctors.     Assess willingness:  Not committed at this point Assist in quit attempt:  Per PCP when ready Arrange follow up:   Follow up per Primary Care planned  For smoking cessation classes call [phone]      ]

## 2023-09-24 ENCOUNTER — Encounter: Payer: Self-pay | Admitting: Family Medicine

## 2023-09-25 NOTE — Telephone Encounter (Signed)
 Results still pending.

## 2023-10-03 ENCOUNTER — Encounter: Payer: Self-pay | Admitting: Family Medicine

## 2023-10-09 ENCOUNTER — Encounter: Payer: Self-pay | Admitting: Family Medicine

## 2023-10-09 ENCOUNTER — Other Ambulatory Visit: Payer: Self-pay | Admitting: Family Medicine

## 2023-10-09 DIAGNOSIS — Z794 Long term (current) use of insulin: Secondary | ICD-10-CM

## 2023-10-09 DIAGNOSIS — E559 Vitamin D deficiency, unspecified: Secondary | ICD-10-CM

## 2023-10-09 MED ORDER — TRULICITY 0.75 MG/0.5ML ~~LOC~~ SOAJ: 0.7500 mg | SUBCUTANEOUS | 0 refills | Status: DC

## 2023-10-09 MED ORDER — VITAMIN D (ERGOCALCIFEROL) 1.25 MG (50000 UNIT) PO CAPS: 50000.0000 [IU] | ORAL_CAPSULE | ORAL | 1 refills | Status: DC

## 2023-10-11 NOTE — Telephone Encounter (Signed)
Kindly place orders.

## 2023-10-12 ENCOUNTER — Other Ambulatory Visit: Payer: Self-pay

## 2023-10-12 ENCOUNTER — Other Ambulatory Visit: Payer: Self-pay | Admitting: Family Medicine

## 2023-10-12 DIAGNOSIS — E119 Type 2 diabetes mellitus without complications: Secondary | ICD-10-CM

## 2023-10-12 DIAGNOSIS — F419 Anxiety disorder, unspecified: Secondary | ICD-10-CM

## 2023-10-12 MED ORDER — BLOOD GLUCOSE MONITORING SUPPL DEVI: 1.0000 | Freq: Three times a day (TID) | 0 refills | Status: DC

## 2023-10-12 MED ORDER — BLOOD GLUCOSE TEST VI STRP
1.0000 | ORAL_STRIP | Freq: Three times a day (TID) | 0 refills | Status: AC
Start: 2023-10-12 — End: 2023-11-11

## 2023-10-12 MED ORDER — LANCET DEVICE MISC: 1.0000 | Freq: Three times a day (TID) | 0 refills | Status: AC

## 2023-10-12 MED ORDER — LANCETS MISC. MISC: 1.0000 | Freq: Three times a day (TID) | 0 refills | Status: AC

## 2023-10-12 NOTE — Telephone Encounter (Signed)
 Orders placed.

## 2023-11-13 ENCOUNTER — Other Ambulatory Visit: Payer: Self-pay

## 2023-11-13 ENCOUNTER — Telehealth: Payer: Self-pay

## 2023-11-13 DIAGNOSIS — Z794 Long term (current) use of insulin: Secondary | ICD-10-CM

## 2023-11-13 MED ORDER — SAFETY LANCETS 28G MISC: 5 refills | Status: AC

## 2023-11-13 MED ORDER — BLOOD GLUCOSE MONITORING SUPPL DEVI: 1.0000 | Freq: Three times a day (TID) | 0 refills | Status: AC

## 2023-11-13 NOTE — Telephone Encounter (Signed)
 Needing the next dose of trulicity called in to [facility]

## 2023-11-13 NOTE — Telephone Encounter (Signed)
 Copied from CRM [id number]. Topic: Clinical - Prescription Issue >> Nov 13, 2023  9:48 AM Chrystal Crape R wrote: Pt needs all Orders from 05/02 all diabetic supplies &  Dulaglutide (TRULICITY) 0.75 MG/0.5ML SOAJ [[id number]]BJYN to correct pharmacy: [facility] [address] Geraldene Kleine [place], [place] [address], [phone]

## 2023-11-16 ENCOUNTER — Other Ambulatory Visit: Payer: Self-pay | Admitting: Family Medicine

## 2023-11-16 DIAGNOSIS — Z794 Long term (current) use of insulin: Secondary | ICD-10-CM

## 2023-11-16 MED ORDER — TRULICITY 1.5 MG/0.5ML ~~LOC~~ SOAJ
1.5000 mg | SUBCUTANEOUS | 0 refills | Status: DC
Start: 2023-11-16 — End: 2024-01-07

## 2023-11-26 NOTE — Telephone Encounter (Signed)
Faxed for records

## 2023-12-18 ENCOUNTER — Ambulatory Visit: Payer: Self-pay

## 2023-12-18 ENCOUNTER — Emergency Department (HOSPITAL_COMMUNITY)
Admission: EM | Admit: 2023-12-18 | Discharge: 2023-12-18 | Disposition: A | Discharge status: Home / Self Care | Source: Ambulatory Visit | Attending: Student | Admitting: Student

## 2023-12-18 ENCOUNTER — Encounter (HOSPITAL_COMMUNITY): Payer: Self-pay | Admitting: Emergency Medicine

## 2023-12-18 ENCOUNTER — Emergency Department (HOSPITAL_COMMUNITY)

## 2023-12-18 ENCOUNTER — Other Ambulatory Visit: Payer: Self-pay

## 2023-12-18 DIAGNOSIS — R1013 Epigastric pain: Secondary | ICD-10-CM | POA: Insufficient documentation

## 2023-12-18 DIAGNOSIS — R21 Rash and other nonspecific skin eruption: Secondary | ICD-10-CM | POA: Diagnosis not present

## 2023-12-18 DIAGNOSIS — N200 Calculus of kidney: Secondary | ICD-10-CM | POA: Diagnosis not present

## 2023-12-18 DIAGNOSIS — E876 Hypokalemia: Secondary | ICD-10-CM | POA: Diagnosis not present

## 2023-12-18 DIAGNOSIS — R1011 Right upper quadrant pain: Secondary | ICD-10-CM | POA: Diagnosis not present

## 2023-12-18 DIAGNOSIS — D649 Anemia, unspecified: Secondary | ICD-10-CM | POA: Diagnosis not present

## 2023-12-18 DIAGNOSIS — N281 Cyst of kidney, acquired: Secondary | ICD-10-CM | POA: Diagnosis not present

## 2023-12-18 LAB — URINALYSIS, ROUTINE W REFLEX MICROSCOPIC
Bilirubin Urine: NEGATIVE
Glucose, UA: NEGATIVE mg/dL
Hgb urine dipstick: NEGATIVE
Ketones, ur: NEGATIVE mg/dL
Leukocytes,Ua: NEGATIVE
Nitrite: NEGATIVE
Protein, ur: 30 mg/dL — AB
Specific Gravity, Urine: 1.024 (ref 1.005–1.030)
pH: 6 (ref 5.0–8.0)

## 2023-12-18 LAB — CBC
HCT: 34 % — ABNORMAL LOW (ref 36.0–46.0)
Hemoglobin: 10.4 g/dL — ABNORMAL LOW (ref 12.0–15.0)
MCH: 23 pg — ABNORMAL LOW (ref 26.0–34.0)
MCHC: 30.6 g/dL (ref 30.0–36.0)
MCV: 75.2 fL — ABNORMAL LOW (ref 80.0–100.0)
Platelets: 480 K/uL — ABNORMAL HIGH (ref 150–400)
RBC: 4.52 MIL/uL (ref 3.87–5.11)
RDW: 16.8 % — ABNORMAL HIGH (ref 11.5–15.5)
WBC: 8.8 K/uL (ref 4.0–10.5)
nRBC: 0 % (ref 0.0–0.2)

## 2023-12-18 LAB — COMPREHENSIVE METABOLIC PANEL WITH GFR
ALT: 19 U/L (ref 0–44)
AST: 25 U/L (ref 15–41)
Albumin: 4.4 g/dL (ref 3.5–5.0)
Alkaline Phosphatase: 71 U/L (ref 38–126)
Anion gap: 12 (ref 5–15)
BUN: 9 mg/dL (ref 6–20)
CO2: 23 mmol/L (ref 22–32)
Calcium: 9.8 mg/dL (ref 8.9–10.3)
Chloride: 101 mmol/L (ref 98–111)
Creatinine, Ser: 0.75 mg/dL (ref 0.44–1.00)
GFR, Estimated: >60 mL/min (ref >60)
Glucose, Bld: 130 mg/dL — ABNORMAL HIGH (ref 70–99)
Potassium: 3.3 mmol/L — ABNORMAL LOW (ref 3.5–5.1)
Sodium: 136 mmol/L (ref 135–145)
Total Bilirubin: 0.5 mg/dL (ref 0.0–1.2)
Total Protein: 8 g/dL (ref 6.5–8.1)

## 2023-12-18 LAB — LIPASE, BLOOD: Lipase: 43 U/L (ref 11–51)

## 2023-12-18 LAB — TROPONIN I (HIGH SENSITIVITY): Troponin I (High Sensitivity): <2 ng/L (ref <18)

## 2023-12-18 MED ORDER — HYDROXYZINE HCL 25 MG PO TABS: 25.0000 mg | ORAL_TABLET | Freq: Four times a day (QID) | ORAL | 0 refills | Status: AC | PRN

## 2023-12-18 MED ORDER — PREDNISONE 10 MG PO TABS: 40.0000 mg | ORAL_TABLET | Freq: Every day | ORAL | 0 refills | Status: AC

## 2023-12-18 MED ORDER — LIDOCAINE VISCOUS HCL 2 % MT SOLN
15.0000 mL | Freq: Once | OROMUCOSAL | Status: AC
  Administered 2023-12-18: 15 mL via OROMUCOSAL
  Filled 2023-12-18: qty 15

## 2023-12-18 MED ORDER — POTASSIUM CHLORIDE CRYS ER 20 MEQ PO TBCR: 40.0000 meq | EXTENDED_RELEASE_TABLET | Freq: Once | ORAL | Status: DC

## 2023-12-18 MED ORDER — DICYCLOMINE HCL 20 MG PO TABS
20.0000 mg | ORAL_TABLET | Freq: Two times a day (BID) | ORAL | 0 refills | Status: AC
Start: 2023-12-18 — End: ?

## 2023-12-18 MED ORDER — ONDANSETRON HCL 4 MG/2ML IJ SOLN
4.0000 mg | Freq: Once | INTRAMUSCULAR | Status: AC
  Administered 2023-12-18: 4 mg via INTRAVENOUS
  Filled 2023-12-18: qty 2

## 2023-12-18 MED ORDER — DICYCLOMINE HCL 10 MG PO CAPS
10.0000 mg | ORAL_CAPSULE | Freq: Once | ORAL | Status: AC
  Administered 2023-12-18: 10 mg via ORAL
  Filled 2023-12-18: qty 1

## 2023-12-18 MED ORDER — PANTOPRAZOLE SODIUM 40 MG IV SOLR
40.0000 mg | Freq: Once | INTRAVENOUS | Status: AC
  Administered 2023-12-18: 40 mg via INTRAVENOUS
  Filled 2023-12-18: qty 10

## 2023-12-18 MED ORDER — ONDANSETRON 4 MG PO TBDP: 4.0000 mg | ORAL_TABLET | Freq: Three times a day (TID) | ORAL | 0 refills | Status: AC | PRN

## 2023-12-18 MED ORDER — IOHEXOL 300 MG/ML  SOLN
100.0000 mL | Freq: Once | INTRAMUSCULAR | Status: AC | PRN
  Administered 2023-12-18: 100 mL via INTRAVENOUS

## 2023-12-18 MED ORDER — SODIUM CHLORIDE 0.9 % IV BOLUS
1000.0000 mL | Freq: Once | INTRAVENOUS | Status: AC
  Administered 2023-12-18: 1000 mL via INTRAVENOUS

## 2023-12-18 NOTE — ED Triage Notes (Signed)
 Pt presents with RUQ abdominal pain, emesis, diarrhea, and urticaria.

## 2023-12-18 NOTE — Discharge Instructions (Addendum)
 Follow-up closely with your primary care doctor on an outpatient basis.  Return to emergency department immediately for any new or worsening symptoms.

## 2023-12-18 NOTE — ED Provider Notes (Signed)
 [place] EMERGENCY DEPARTMENT AT [facility] Provider Note   CSN: [id number] Arrival date & time: 12/18/23  1437     Patient presents with: Abdominal Pain and Urticaria   Brittney Reyes is a 53 y.o. female.   Patient is a 53 year old female who presents emergency department chief complaint of right upper quad abdominal pain, nausea, vomiting, diarrhea as well as a rash.  Patient notes that the abdominal pain has been ongoing since this morning.  Patient notes that the rash has been ongoing for positive past 3 days.  The rash is located on legs, arms, trunk.  She denies any new soaps, lotions, deodorants, detergents, medications or antibiotics.  She notes that she has had no fever, chills, chest pain or shortness of breath.  She denies any bug bites or tick bites.   Abdominal Pain Associated symptoms: diarrhea and nausea   Urticaria Associated symptoms include abdominal pain.       Prior to Admission medications   Medication Sig Start Date End Date Taking? Authorizing Provider  dicyclomine  (BENTYL ) 20 MG tablet Take 1 tablet (20 mg total) by mouth 2 (two) times daily. 12/18/23  Yes Daralene Bruckner D, PA-C  hydrOXYzine  (ATARAX ) 25 MG tablet Take 1 tablet (25 mg total) by mouth every 6 (six) hours as needed for itching. 12/18/23  Yes Daralene Bruckner D, PA-C  ondansetron  (ZOFRAN -ODT) 4 MG disintegrating tablet Take 1 tablet (4 mg total) by mouth every 8 (eight) hours as needed for nausea or vomiting. 12/18/23  Yes Daralene Bruckner D, PA-C  predniSONE  (DELTASONE ) 10 MG tablet Take 4 tablets (40 mg total) by mouth daily for 5 days. 12/18/23 12/23/23 Yes Daralene Bruckner BIRCH, PA-C  acetaminophen (TYLENOL) 500 MG tablet Take 1,000 mg by mouth daily as needed for headache.    [provider]  Blood Glucose Monitoring Suppl DEVI 1 each by Does not apply route in the morning, at noon, and at bedtime. May substitute to any manufacturer covered by patient's insurance.  11/13/23   Zarwolo, Gloria, FNP  cyclobenzaprine  (FLEXERIL ) 5 MG tablet Take 1 tablet (5 mg total) by mouth at bedtime. 09/20/23   Zarwolo, Gloria, FNP  Dulaglutide  (TRULICITY ) 1.5 MG/0.5ML SOAJ Inject 1.5 mg into the skin once a week. 11/16/23   Zarwolo, Gloria, FNP  fluticasone  (FLONASE ) 50 MCG/ACT nasal spray Place 2 sprays into both nostrils daily. 09/20/23   Zarwolo, Gloria, FNP  levocetirizine (XYZAL ) 5 MG tablet Take 1 tablet (5 mg total) by mouth every evening. 09/20/23   Zarwolo, Gloria, FNP  meclizine  (ANTIVERT ) 25 MG tablet Take 1 tablet (25 mg total) by mouth 3 (three) times daily. 02/16/23   Mallipeddi, Vishnu P, MD  metFORMIN  (GLUCOPHAGE ) 500 MG tablet Take 2 tablets (1,000 mg total) by mouth daily. 09/20/23   Zarwolo, Gloria, FNP  metoprolol  succinate (TOPROL -XL) 25 MG 24 hr tablet Take 1 tablet (25 mg total) by mouth daily. 09/20/23   Zarwolo, Gloria, FNP  omeprazole  (PRILOSEC) 20 MG capsule Take 1 capsule (20 mg total) by mouth daily. 09/20/23   Zarwolo, Gloria, FNP  PARoxetine  (PAXIL ) 10 MG tablet Take 1 tablet (10 mg total) by mouth daily. 09/20/23   Zarwolo, Gloria, FNP  Relugolix-Estradiol-Norethind (MYFEMBREE ) 40-1-0.5 MG TABS Take 1 tablet by mouth daily. 08/24/23   Jayne Vonn DEL, MD  rosuvastatin  (CRESTOR ) 10 MG tablet Take 1 tablet (10 mg total) by mouth daily. 09/20/23   Zarwolo, Gloria, FNP  Safety Lancets 28G MISC Use to check blood sugar up to three  times daily DX e11.65 11/13/23   Zarwolo, Gloria, FNP  triamterene -hydrochlorothiazide (MAXZIDE-25) 37.5-25 MG tablet Take 1 tablet by mouth daily. 09/20/23   Zarwolo, Gloria, FNP  Vitamin D , Ergocalciferol , (DRISDOL ) 1.25 MG (50000 UNIT) CAPS capsule Take 1 capsule (50,000 Units total) by mouth every 7 (seven) days. 10/09/23   Zarwolo, Gloria, FNP    Allergies: Duloxetine, Fluoxetine, Lactose intolerance (gi), Milk (cow), Gabapentin, Prozac [fluoxetine hcl], Wellbutrin [bupropion], and Gluten meal    Review of Systems  Gastrointestinal:   Positive for abdominal pain, diarrhea and nausea.  Skin:  Positive for rash.    Updated Vital Signs BP (!) 158/93 (BP Location: Right Arm)   Pulse 87   Temp 98.6 F (37 C) (Oral)   Resp 18   Ht 5' 6 (1.676 m)   Wt 78 kg   SpO2 98%   BMI 27.76 kg/m   Physical Exam Vitals and nursing note reviewed.  Constitutional:      Appearance: Normal appearance.  HENT:     Head: Normocephalic and atraumatic.     Nose: Nose normal.     Mouth/Throat:     Mouth: Mucous membranes are moist.  Eyes:     Extraocular Movements: Extraocular movements intact.     Conjunctiva/sclera: Conjunctivae normal.     Pupils: Pupils are equal, round, and reactive to light.  Cardiovascular:     Rate and Rhythm: Normal rate and regular rhythm.     Pulses: Normal pulses.     Heart sounds: Normal heart sounds. No murmur heard. Pulmonary:     Effort: Pulmonary effort is normal. No respiratory distress.     Breath sounds: Normal breath sounds. No stridor. No wheezing, rhonchi or rales.  Abdominal:     General: Abdomen is flat. Bowel sounds are normal. There is no distension. There are no signs of injury.     Palpations: Abdomen is soft.     Tenderness: There is generalized abdominal tenderness. Negative signs include Murphy's sign and McBurney's sign.     Hernia: No hernia is present.  Musculoskeletal:        General: Normal range of motion.     Cervical back: Normal range of motion and neck supple.  Skin:    General: Skin is warm and dry.     Comments: Papular rash noted over bilateral upper and lower extremities, trunk, no petechiae, purpura, bullae, no lesions to palms, soles, oral mucosa  Neurological:     General: No focal deficit present.     Mental Status: She is alert and oriented to person, place, and time. Mental status is at baseline.  Psychiatric:        Mood and Affect: Mood normal.        Behavior: Behavior normal.        Thought Content: Thought content normal.        Judgment: Judgment  normal.     (all labs ordered are listed, but only abnormal results are displayed) Labs Reviewed  COMPREHENSIVE METABOLIC PANEL WITH GFR - Abnormal; Notable for the following components:      Result Value   Potassium 3.3 (*)    Glucose, Bld 130 (*)    All other components within normal limits  CBC - Abnormal; Notable for the following components:   Hemoglobin 10.4 (*)    HCT 34.0 (*)    MCV 75.2 (*)    MCH 23.0 (*)    RDW 16.8 (*)    Platelets 480 (*)  All other components within normal limits  URINALYSIS, ROUTINE W REFLEX MICROSCOPIC - Abnormal; Notable for the following components:   APPearance HAZY (*)    Protein, ur 30 (*)    Bacteria, UA RARE (*)    All other components within normal limits  LIPASE, BLOOD  TROPONIN I (HIGH SENSITIVITY)    EKG: None  Radiology: CT ABDOMEN PELVIS W CONTRAST Result Date: 12/18/2023 CLINICAL DATA:  Epigastric pain. EXAM: CT ABDOMEN AND PELVIS WITH CONTRAST TECHNIQUE: Multidetector CT imaging of the abdomen and pelvis was performed using the standard protocol following bolus administration of intravenous contrast. RADIATION DOSE REDUCTION: This exam was performed according to the departmental dose-optimization program which includes automated exposure control, adjustment of the mA and/or kV according to patient size and/or use of iterative reconstruction technique. CONTRAST:  OMNIPAQUE  IOHEXOL  300 MG/ML  SOLN COMPARISON:  CT abdomen pelvis dated 06/21/2013. FINDINGS: Lower chest: The visualized lung bases are clear. No intra-abdominal free air or free fluid. Hepatobiliary: Fatty appearing liver. No biliary dilatation. Cholecystectomy. Pancreas: Unremarkable. No pancreatic ductal dilatation or surrounding inflammatory changes. Spleen: Normal in size without focal abnormality. Adrenals/Urinary Tract: The adrenal glands unremarkable. Punctate nonobstructing bilateral renal calculi. Small right renal inferior pole cyst. There is no hydronephrosis  on either side. There is symmetric enhancement and excretion of contrast by both kidneys. The visualized ureters and urinary bladder appear unremarkable. Stomach/Bowel: The stomach is distended. There is no bowel obstruction or active inflammation. The appendix is not visualized with certainty. No inflammatory changes identified in the right lower quadrant. Vascular/Lymphatic: The abdominal aorta and IVC are unremarkable. No portal venous gas. There is no adenopathy. Reproductive: The uterus is retroflexed. No suspicious adnexal masses. Other: Shunt catheter with tip over the dome of the liver. No fluid collection. Musculoskeletal: No acute or significant osseous findings. IMPRESSION: 1. No acute intra-abdominal or pelvic pathology. 2. Punctate nonobstructing bilateral renal calculi. No hydronephrosis. 3. Fatty liver. Electronically Signed   By: Vanetta Chou M.D.   On: 12/18/2023 16:23     Procedures   Medications Ordered in the ED  potassium chloride  SA (KLOR-CON  M) CR tablet 40 mEq (has no administration in time range)  sodium chloride  0.9 % bolus 1,000 mL (1,000 mLs Intravenous New Bag/Given 12/18/23 1531)  ondansetron  (ZOFRAN ) injection 4 mg (4 mg Intravenous Given 12/18/23 1534)  dicyclomine  (BENTYL ) capsule 10 mg (10 mg Oral Given by Other 12/18/23 1532)  lidocaine  (XYLOCAINE ) 2 % viscous mouth solution 15 mL (15 mLs Mouth/Throat Given 12/18/23 1532)  pantoprazole  (PROTONIX ) injection 40 mg (40 mg Intravenous Given 12/18/23 1534)  iohexol  (OMNIPAQUE ) 300 MG/ML solution 100 mL (100 mLs Intravenous Contrast Given 12/18/23 1609)                                    Medical Decision Making Amount and/or Complexity of Data Reviewed Labs: ordered. Radiology: ordered.  Risk Prescription drug management.   This patient presents to the ED for concern of abdominal pain and rash differential diagnosis includes acute appendicitis, cholecystitis, bowel torsion, diverticulitis, ovarian torsion or cyst,  PID, tumor and abscess, pyelonephritis, kidney stone, Stevens-Johnson syndrome, toxic abdominal lysis, tickborne illness, syphilis, meningitis    Additional history obtained:  Additional history obtained from none External records from outside source obtained and reviewed including none   Lab Tests:  I Ordered, and personally interpreted labs.  The pertinent results include: No leukocytosis, mild anemia, mild hypokalemia,  normal liver function kidney function, negative troponin, unremarkable urinalysis   Imaging Studies ordered:  I ordered imaging studies including CT scan of abdomen and pelvis I independently visualized and interpreted imaging which showed no acute surgical process I agree with the radiologist interpretation   Medicines ordered and prescription drug management:  I ordered medication including Bentyl , Zofran , Protonix , GI cocktail, IV fluids  for abdominal pain Reevaluation of the patient after these medicines showed that the patient improved I have reviewed the patients home medicines and have made adjustments as needed   Problem List / ED Course:  Patient is doing very well at this time and is stable for discharge home.  Discussed with patient that CT scan of the abdomen pelvis demonstrated no signs of acute surgical process.  Rash appears to be more allergic versus insect bite at this time.  Do not suspect Stevens-Johnson syndrome, toxic epidermal necrolysis, tickborne illness, syphilis, meningitis, scabies.  Will treat symptomatically on outpatient basis for this.  Blood work has otherwise been unremarkable and patient has no indication of urinary tract infection.  Discussed with patient need for close follow-up with her primary care doctor on an outpatient basis.  Strict return precautions were discussed as well for any new or worsening symptoms.  Patient voiced understanding and had no additional questions.   Social Determinants of Health:  None         Final diagnoses:  Epigastric pain  Rash and nonspecific skin eruption  Hypokalemia  Anemia, unspecified type    ED Discharge Orders          Ordered    predniSONE  (DELTASONE ) 10 MG tablet  Daily        12/18/23 1716    hydrOXYzine  (ATARAX ) 25 MG tablet  Every 6 hours PRN        12/18/23 1716    dicyclomine  (BENTYL ) 20 MG tablet  2 times daily        12/18/23 1716    ondansetron  (ZOFRAN -ODT) 4 MG disintegrating tablet  Every 8 hours PRN        12/18/23 1716               Daralene Lonni BIRCH, PA-C 12/18/23 1720    Albertina Dixon, MD 12/18/23 1949

## 2023-12-18 NOTE — Telephone Encounter (Signed)
 FYI Only or Action Required?: FYI only for provider.  Patient was last seen in primary care on 09/20/2023 by Zarwolo, Gloria, FNP.  Called Nurse Triage reporting Rash.  Symptoms began several days ago.  Interventions attempted: OTC medications: OTC creams and calamine lotion.  Symptoms are: gradually worsening.  Triage Disposition: See HCP Within 4 Hours (Or PCP Triage)- No appts avail. This RN instructed patient to go to UC within next 4 hours. Pt is agreeable.   Patient/caregiver understands and will follow disposition?: Yes           Copied from CRM [id number]. Topic: Clinical - Red Word Triage >> Dec 18, 2023  1:39 PM Selinda RAMAN wrote: Red Word that prompted transfer to Nurse Triage: The patient called in stating she has broke out in a rash from head to toe. It started Sunday night after she had gotten into her sons pool. She says it is blistery, itchy and painful and she has bad nausea as well. I will transfer her to E2C2 NT Reason for Disposition  Large or small blisters on skin (i.e., fluid filled bubbles or sacs)  Answer Assessment - Initial Assessment Questions 1. APPEARANCE of RASH: Describe the rash. (e.g., spots, blisters, raised areas, skin peeling, scaly)     Blistery, itchy, and painful  2. SIZE: How big are the spots? (e.g., tip of pen, eraser, coin; inches, centimeters)     Stomach- looks like hives, but most spots are huge. Then tiny spots that blisering  3. LOCATION: Where is the rash located?     Started around stomach area, now diffuse all over body  4. COLOR: What color is the rash? (Note: It is difficult to assess rash color in people with darker-colored skin. When this situation occurs, simply ask the caller to describe what they see.)     Red  5. ONSET: When did the rash begin?     Started little spots 2 days, on Sunday night. By Monday, it was all over body  6. FEVER: Do you have a fever? If Yes, ask: What is your temperature, how was it  measured, and when did it start?     No  7. ITCHING: Does the rash itch? If Yes, ask: How bad is the itch? (Scale 1-10; or mild, moderate, severe)     Mild to moderate  8. CAUSE: What do you think is causing the rash?     Unsure of cause  9. MEDICINE FACTORS: Have you started any new medicines within the last 2 weeks? (e.g., antibiotics)      Started Trulicity  3 weeks ago  10. OTHER SYMPTOMS: Do you have any other symptoms? (e.g., dizziness, headache, sore throat, joint pain)       Nausea, headache, pain in Left upper side  11. PREGNANCY: Is there any chance you are pregnant? When was your last menstrual period?       LMP-Unsure. Pt says I shouldn't be pregnant  Protocols used: Rash or Redness - [facility]

## 2023-12-26 ENCOUNTER — Other Ambulatory Visit: Payer: Self-pay | Admitting: Family Medicine

## 2023-12-26 DIAGNOSIS — J301 Allergic rhinitis due to pollen: Secondary | ICD-10-CM

## 2023-12-26 DIAGNOSIS — K219 Gastro-esophageal reflux disease without esophagitis: Secondary | ICD-10-CM

## 2024-01-07 ENCOUNTER — Other Ambulatory Visit: Payer: Self-pay | Admitting: Family Medicine

## 2024-01-07 DIAGNOSIS — Z794 Long term (current) use of insulin: Secondary | ICD-10-CM

## 2024-01-07 MED ORDER — TRULICITY 3 MG/0.5ML ~~LOC~~ SOAJ: 3.0000 mg | SUBCUTANEOUS | 0 refills | Status: DC

## 2024-01-17 ENCOUNTER — Ambulatory Visit: Payer: Self-pay

## 2024-01-17 NOTE — Telephone Encounter (Signed)
 Can this pt be added on for an acute visit with an avaiable provider

## 2024-01-17 NOTE — Telephone Encounter (Signed)
 Added to 8/13 at 3 pm with Leita

## 2024-01-17 NOTE — Telephone Encounter (Signed)
 FYI Only or Action Required?: Action required by provider: request for appointment. Requesting lab work.  Patient was last seen in primary care on 09/20/2023 by Zarwolo, Gloria, FNP.  Called Nurse Triage reporting Generalized Body Aches and Palpitations. Low potassium - appt for today was cancelled.  Symptoms began several weeks ago.  Interventions attempted: Other: eating bananas.  Symptoms are: unchanged.  Triage Disposition: See HCP Within 4 Hours (Or PCP Triage)  Patient/caregiver understands and will follow disposition?: Yes                    Copied from CRM [id number]. Topic: Clinical - Red Word Triage >> Jan 17, 2024 11:02 AM Ivette P wrote: Red Word that prompted transfer to Nurse Triage: potassium is low and having body aches and pain. Reason for Disposition  [1] MODERATE pain (e.g., interferes with normal activities) AND [2] present > 3 days  [1] Heart beating very rapidly (e.g., > 140 / minute) AND [2] NOT present now  (Exception: Only during exercise.)  Answer Assessment - Initial Assessment Questions 1. ONSET: When did the muscle aches or body pains start?      Bone aches 2. LOCATION: What part of your body is hurting? (e.g., entire body, arms, legs)      everywhere 3. SEVERITY: How bad is the pain? (Scale 1-10; or mild, moderate, severe)     moderate 4. CAUSE: What do you think is causing the pains?     Low potassium 5. FEVER: Do you have a fever? If Yes, ask: What is your temperature, how was it measured, and  when did it start?      no 6. OTHER SYMPTOMS: Do you have any other symptoms? (e.g., chest pain, cold or flu symptoms, rash, weakness, weight loss)     no  Answer Assessment - Initial Assessment Questions 1. DESCRIPTION: Please describe your heart rate or heartbeat that you are having (e.g., fast/slow, regular/irregular, skipped or extra beats, palpitations)     Pt is having palpitations off and on she states d/t low  potassium 2. ONSET: When did it start? (e.g., minutes, hours, days)      Ongoing  3. DURATION: How long does it last (e.g., seconds, minutes, hours)     unsure 4. PATTERN Does it come and go, or has it been constant since it started?  Does it get worse with exertion?   Are you feeling it now?     Off and on  7. RECURRENT SYMPTOM: Have you ever had this before? If Yes, ask: When was the last time? and What happened that time?      yes 8. CAUSE: What do you think is causing the palpitations?     Low potassium  Protocols used: Muscle Aches and Body Pain-A-AH, Heart Rate and Heartbeat Questions-A-AH

## 2024-01-21 ENCOUNTER — Ambulatory Visit: Admitting: Family Medicine

## 2024-01-23 ENCOUNTER — Ambulatory Visit

## 2024-01-23 VITALS — BP 123/82 | HR 87 | Ht 66.0 in | Wt 168.0 lb

## 2024-01-23 DIAGNOSIS — K529 Noninfective gastroenteritis and colitis, unspecified: Secondary | ICD-10-CM | POA: Diagnosis not present
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
## 2024-01-25 DIAGNOSIS — K529 Noninfective gastroenteritis and colitis, unspecified: Secondary | ICD-10-CM | POA: Insufficient documentation

## 2024-01-25 NOTE — Assessment & Plan Note (Signed)
 Chronic diarrhea likely due to previous cholecystectomy, presenting with yellow, acidic stools. Dietary modifications implemented. - Recommend bile acid supplements from [employer] or [employer]. - Continue avoiding spicy and fatty foods.  Right-sided abdominal pain Right-sided abdominal pain with swelling, gas, and diarrhea. CT showed no acute findings, noted fatty liver. Appendix not clearly visualized.  Recommend GI consult if no improvement

## 2024-02-05 ENCOUNTER — Ambulatory Visit: Admitting: Obstetrics & Gynecology

## 2024-02-12 ENCOUNTER — Other Ambulatory Visit: Payer: Self-pay | Admitting: Family Medicine

## 2024-02-12 DIAGNOSIS — E119 Type 2 diabetes mellitus without complications: Secondary | ICD-10-CM

## 2024-02-28 ENCOUNTER — Encounter (HOSPITAL_COMMUNITY): Payer: Self-pay | Admitting: Family Medicine

## 2024-03-11 ENCOUNTER — Other Ambulatory Visit: Payer: Self-pay | Admitting: Family Medicine

## 2024-03-11 DIAGNOSIS — E119 Type 2 diabetes mellitus without complications: Secondary | ICD-10-CM

## 2024-03-19 ENCOUNTER — Other Ambulatory Visit (HOSPITAL_COMMUNITY)
Admission: RE | Admit: 2024-03-19 | Discharge: 2024-03-19 | Disposition: A | Discharge status: Home / Self Care | Source: Ambulatory Visit | Attending: Obstetrics & Gynecology | Admitting: Obstetrics & Gynecology

## 2024-03-19 ENCOUNTER — Ambulatory Visit (INDEPENDENT_AMBULATORY_CARE_PROVIDER_SITE_OTHER): Admitting: Obstetrics & Gynecology

## 2024-03-19 VITALS — BP 120/83 | HR 75 | Ht 66.0 in | Wt 161.2 lb

## 2024-03-19 DIAGNOSIS — E038 Other specified hypothyroidism: Secondary | ICD-10-CM | POA: Diagnosis not present

## 2024-03-19 DIAGNOSIS — D219 Benign neoplasm of connective and other soft tissue, unspecified: Secondary | ICD-10-CM | POA: Diagnosis not present

## 2024-03-19 DIAGNOSIS — Z124 Encounter for screening for malignant neoplasm of cervix: Secondary | ICD-10-CM | POA: Insufficient documentation

## 2024-03-19 DIAGNOSIS — Z1331 Encounter for screening for depression: Secondary | ICD-10-CM | POA: Diagnosis not present

## 2024-03-19 DIAGNOSIS — E876 Hypokalemia: Secondary | ICD-10-CM

## 2024-03-19 DIAGNOSIS — Z1151 Encounter for screening for human papillomavirus (HPV): Secondary | ICD-10-CM

## 2024-03-19 DIAGNOSIS — Z01419 Encounter for gynecological examination (general) (routine) without abnormal findings: Secondary | ICD-10-CM

## 2024-03-19 DIAGNOSIS — M898X9 Other specified disorders of bone, unspecified site: Secondary | ICD-10-CM

## 2024-03-19 NOTE — Progress Notes (Unsigned)
 Subjective:     Brittney Reyes is a 53 y.o. female here for a routine exam.  No LMP recorded (lmp unknown). (Menstrual status: Perimenopausal). No obstetric history on file. Birth Control Method:  on myfembree  Menstrual Calendar(currently): amenorrheic on myfembree  Current complaints: bone pain.   Current acute medical issues:  bone and body pain   Recent Gynecologic History No LMP recorded (lmp unknown). (Menstrual status: Perimenopausal). Last Pap: ?2014,  normal Last mammogram: 12/2022,  normal  Past Medical History:  Diagnosis Date   Anxiety    Diabetes mellitus without complication (HCC)    Hydrocephalus (HCC)    Hypercholesteremia    Hypertension     Past Surgical History:  Procedure Laterality Date   CESAREAN SECTION     CHOLECYSTECTOMY     COLONOSCOPY WITH PROPOFOL  N/A 03/21/2022   Procedure: COLONOSCOPY WITH PROPOFOL ;  Surgeon: Cindie Carlin POUR, DO;  Location: [facility];  Service: Endoscopy;  Laterality: N/A;  12:00pm, asa 2, pt knows to arrive at 8:00   LEEP SURGERY     SHUT IN STOMACH     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     Social History   Socioeconomic History   Marital status: Married    Spouse name: Not on file   Number of children: 6   Years of education: Not on file   Highest education level: Associate degree: occupational, Scientist, product/process development, or vocational program  Occupational History   Not on file  Tobacco Use   Smoking status: Every Day    Current packs/day: 1.00    Average packs/day: 1 pack/day for 37.1 years (37.1 ttl pk-yrs)    Types: Cigarettes    Start date: 01/30/1987   Smokeless tobacco: Never   Tobacco comments:    Quit   Vaping Use   Vaping status: Never Used  Substance and Sexual Activity   Alcohol use: No   Drug use: No   Sexual activity: Yes    Birth control/protection: Surgical    Comment: Tubal  Other Topics Concern   Not on file  Social History Narrative    Not on file   Social Drivers of Health   Financial Resource Strain: Medium Risk (03/19/2024)   Overall Financial Resource Strain (CARDIA)    Difficulty of Paying Living Expenses: Somewhat hard  Food Insecurity: No Food Insecurity (03/19/2024)   Hunger Vital Sign    Worried About Running Out of Food in the Last Year: Never true    Ran Out of Food in the Last Year: Never true  Transportation Needs: No Transportation Needs (03/19/2024)   PRAPARE - Administrator, Civil Service (Medical): No    Lack of Transportation (Non-Medical): No  Physical Activity: Inactive (03/19/2024)   Exercise Vital Sign    Days of Exercise per Week: 0 days    Minutes of Exercise per Session: 0 min  Stress: Stress Concern Present (03/19/2024)   [name] of Occupational Health - Occupational Stress Questionnaire    Feeling of Stress: To some extent  Social Connections: Socially Integrated (03/19/2024)   Social Connection and Isolation Panel    Frequency of Communication with Friends and Family: More than three times a week    Frequency of Social Gatherings with Friends and Family: Twice a week    Attends Religious Services: More than 4 times per year    Active Member of [employer] or Organizations: Yes  Attends Engineer, structural: More than 4 times per year    Marital Status: Married    Family History  Problem Relation Age of Onset   Diabetes Mother    Hypertension Mother    Hypertension Father    Migraines Daughter    Asthma Child    Colon cancer Neg Hx    Colon polyps Neg Hx      Current Outpatient Medications:    acetaminophen (TYLENOL) 500 MG tablet, Take 1,000 mg by mouth daily as needed for headache., Disp: , Rfl:    cyclobenzaprine  (FLEXERIL ) 5 MG tablet, Take 1 tablet (5 mg total) by mouth at bedtime., Disp: 30 tablet, Rfl: 1   fluticasone  (FLONASE ) 50 MCG/ACT nasal spray, Place 2 sprays into both nostrils daily., Disp: 16 g, Rfl: 6   hydrOXYzine  (ATARAX ) 25 MG  tablet, Take 1 tablet (25 mg total) by mouth every 6 (six) hours as needed for itching., Disp: 12 tablet, Rfl: 0   levocetirizine (XYZAL ) 5 MG tablet, TAKE ONE TABLET BY MOUTH IN THE EVENING, Disp: 60 tablet, Rfl: 1   meclizine  (ANTIVERT ) 25 MG tablet, Take 1 tablet (25 mg total) by mouth 3 (three) times daily., Disp: 270 tablet, Rfl: 3   metFORMIN  (GLUCOPHAGE ) 500 MG tablet, Take 2 tablets (1,000 mg total) by mouth daily., Disp: 180 tablet, Rfl: 3   metoprolol  succinate (TOPROL -XL) 25 MG 24 hr tablet, Take 1 tablet (25 mg total) by mouth daily., Disp: 90 tablet, Rfl: 1   omeprazole  (PRILOSEC) 20 MG capsule, TAKE ONE CAPSULE BY MOUTH ONCE DAILY, Disp: 60 capsule, Rfl: 1   ondansetron  (ZOFRAN -ODT) 4 MG disintegrating tablet, Take 1 tablet (4 mg total) by mouth every 8 (eight) hours as needed for nausea or vomiting., Disp: 20 tablet, Rfl: 0   Relugolix-Estradiol-Norethind (MYFEMBREE ) 40-1-0.5 MG TABS, Take 1 tablet by mouth daily., Disp: 30 tablet, Rfl: 11   rosuvastatin  (CRESTOR ) 10 MG tablet, Take 1 tablet (10 mg total) by mouth daily., Disp: 90 tablet, Rfl: 3   triamterene -hydrochlorothiazide (MAXZIDE-25) 37.5-25 MG tablet, Take 1 tablet by mouth daily., Disp: 90 tablet, Rfl: 2   TRULICITY  3 MG/0.5ML SOAJ, INJECT 3MG  INTO THE SKIN ONCE WEEKLY, Disp: 2 mL, Rfl: 0   Blood Glucose Monitoring Suppl DEVI, 1 each by Does not apply route in the morning, at noon, and at bedtime. May substitute to any manufacturer covered by patient's insurance. (Patient not taking: Reported on 03/19/2024), Disp: 1 each, Rfl: 0   dicyclomine  (BENTYL ) 20 MG tablet, Take 1 tablet (20 mg total) by mouth 2 (two) times daily. (Patient not taking: Reported on 03/19/2024), Disp: 20 tablet, Rfl: 0   PARoxetine  (PAXIL ) 10 MG tablet, Take 1 tablet (10 mg total) by mouth daily. (Patient not taking: Reported on 03/19/2024), Disp: 60 tablet, Rfl: 1   Safety Lancets 28G MISC, Use to check blood sugar up to three times daily DX e11.65 (Patient  not taking: Reported on 03/19/2024), Disp: 100 each, Rfl: 5   Vitamin D , Ergocalciferol , (DRISDOL ) 1.25 MG (50000 UNIT) CAPS capsule, Take 1 capsule (50,000 Units total) by mouth every 7 (seven) days. (Patient not taking: Reported on 03/19/2024), Disp: 27 capsule, Rfl: 1  Review of Systems  Review of Systems  Constitutional: Negative for fever, chills, weight loss, malaise/fatigue and diaphoresis.  HENT: Negative for hearing loss, ear pain, nosebleeds, congestion, sore throat, neck pain, tinnitus and ear discharge.   Eyes: Negative for blurred vision, double vision, photophobia, pain, discharge and redness.  Respiratory: Negative for  cough, hemoptysis, sputum production, shortness of breath, wheezing and stridor.   Cardiovascular: Negative for chest pain, palpitations, orthopnea, claudication, leg swelling and PND.  Gastrointestinal: negative for abdominal pain. Negative for heartburn, nausea, vomiting, diarrhea, constipation, blood in stool and melena.  Genitourinary: Negative for dysuria, urgency, frequency, hematuria and flank pain.  Musculoskeletal: Negative for myalgias, back pain, joint pain and falls.  Skin: Negative for itching and rash.  Neurological: Negative for dizziness, tingling, tremors, sensory change, speech change, focal weakness, seizures, loss of consciousness, weakness and headaches.  Endo/Heme/Allergies: Negative for environmental allergies and polydipsia. Does not bruise/bleed easily.  Psychiatric/Behavioral: Negative for depression, suicidal ideas, hallucinations, memory loss and substance abuse. The patient is not nervous/anxious and does not have insomnia.        Objective:  Blood pressure 120/83, pulse 75, height 5' 6 (1.676 m), weight 161 lb 4 oz (73.1 kg).   Physical Exam  Vitals reviewed. Constitutional: She is oriented to person, place, and time. She appears well-developed and well-nourished.  HENT:  Head: Normocephalic and atraumatic.        Right Ear:  External ear normal.  Left Ear: External ear normal.  Nose: Nose normal.  Mouth/Throat: Oropharynx is clear and moist.  Eyes: Conjunctivae and EOM are normal. Pupils are equal, round, and reactive to light. Right eye exhibits no discharge. Left eye exhibits no discharge. No scleral icterus.  Neck: Normal range of motion. Neck supple. No tracheal deviation present. No thyromegaly present.  Cardiovascular: Normal rate, regular rhythm, normal heart sounds and intact distal pulses.  Exam reveals no gallop and no friction rub.   No murmur heard. Respiratory: Effort normal and breath sounds normal. No respiratory distress. She has no wheezes. She has no rales. She exhibits no tenderness.  GI: Soft. Bowel sounds are normal. She exhibits no distension and no mass. There is no tenderness. There is no rebound and no guarding.  Genitourinary:  Breasts no masses skin changes or nipple changes bilaterally      Vulva is normal without lesions Vagina is pink moist without discharge Cervix normal in appearance and pap is done Uterus is normal size shape and contour Adnexa is negative with normal sized ovaries   Musculoskeletal: Normal range of motion. She exhibits no edema and no tenderness.  Neurological: She is alert and oriented to person, place, and time. She has normal reflexes. She displays normal reflexes. No cranial nerve deficit. She exhibits normal muscle tone. Coordination normal.  Skin: Skin is warm and dry. No rash noted. No erythema. No pallor.  Psychiatric: She has a normal mood and affect. Her behavior is normal. Judgment and thought content normal.       Medications Ordered at today's visit: No orders of the defined types were placed in this encounter.   Other orders placed at today's visit: Orders Placed This Encounter  Procedures   Comprehensive metabolic panel   Thyroid  Panel With TSH   ANA     ASSESSMENT + PLAN:    ICD-10-CM   1. Well woman exam with routine  gynecological exam  Z01.419     2. Fibroids, 303 cc, on  myfembree , amenorrhea, happy with it  D21.9     3. Routine Papanicolaou smear  Z12.4 Cytology - PAP    4. Bone pain: stop crestor , check ANA  M89.8X9 ANA    5. Hypokalemia: check CMP  E87.6 Comprehensive metabolic panel    6. Other specified hypothyroidism: TFTs  E03.8 Thyroid  Panel With TSH  No follow-ups on file.

## 2024-03-20 ENCOUNTER — Encounter: Payer: Self-pay | Admitting: Obstetrics & Gynecology

## 2024-03-20 LAB — THYROID PANEL WITH TSH
Free Thyroxine Index: 2.3 (ref 1.2–4.9)
T3 Uptake Ratio: 26 % (ref 24–39)
T4, Total: 8.9 ug/dL (ref 4.5–12.0)
TSH: 1.37 u[IU]/mL (ref 0.450–4.500)

## 2024-03-21 LAB — COMPREHENSIVE METABOLIC PANEL WITH GFR
ALT: 18 IU/L (ref 0–32)
AST: 19 IU/L (ref 0–40)
Albumin: 4.8 g/dL (ref 3.8–4.9)
Alkaline Phosphatase: 81 IU/L (ref 49–135)
BUN/Creatinine Ratio: 9 (ref 9–23)
BUN: 7 mg/dL (ref 6–24)
Bilirubin Total: 0.2 mg/dL (ref 0.0–1.2)
CO2: 23 mmol/L (ref 20–29)
Calcium: 9.7 mg/dL (ref 8.7–10.2)
Chloride: 96 mmol/L (ref 96–106)
Creatinine, Ser: 0.81 mg/dL (ref 0.57–1.00)
Globulin, Total: 2.4 g/dL (ref 1.5–4.5)
Glucose: 88 mg/dL (ref 70–99)
Potassium: 3.6 mmol/L (ref 3.5–5.2)
Sodium: 139 mmol/L (ref 134–144)
Total Protein: 7.2 g/dL (ref 6.0–8.5)
eGFR: 88 mL/min/1.73 (ref >59)

## 2024-03-21 LAB — ANA

## 2024-03-24 ENCOUNTER — Ambulatory Visit (HOSPITAL_COMMUNITY): Payer: Self-pay | Admitting: Obstetrics & Gynecology

## 2024-03-24 LAB — CYTOLOGY - PAP
Comment: NEGATIVE
Diagnosis: NEGATIVE
High risk HPV: NEGATIVE

## 2024-03-28 ENCOUNTER — Other Ambulatory Visit: Payer: Self-pay

## 2024-03-28 DIAGNOSIS — Z87891 Personal history of nicotine dependence: Secondary | ICD-10-CM

## 2024-03-28 DIAGNOSIS — Z122 Encounter for screening for malignant neoplasm of respiratory organs: Secondary | ICD-10-CM

## 2024-03-28 DIAGNOSIS — F17210 Nicotine dependence, cigarettes, uncomplicated: Secondary | ICD-10-CM

## 2024-04-17 ENCOUNTER — Ambulatory Visit
Admission: RE | Admit: 2024-04-17 | Discharge: 2024-04-17 | Disposition: A | Discharge status: Home / Self Care | Source: Ambulatory Visit | Attending: Acute Care | Admitting: Acute Care

## 2024-04-17 DIAGNOSIS — F17210 Nicotine dependence, cigarettes, uncomplicated: Secondary | ICD-10-CM | POA: Diagnosis not present

## 2024-04-17 DIAGNOSIS — Z122 Encounter for screening for malignant neoplasm of respiratory organs: Secondary | ICD-10-CM

## 2024-04-17 DIAGNOSIS — Z87891 Personal history of nicotine dependence: Secondary | ICD-10-CM

## 2024-04-23 ENCOUNTER — Other Ambulatory Visit: Payer: Self-pay

## 2024-04-23 DIAGNOSIS — F17210 Nicotine dependence, cigarettes, uncomplicated: Secondary | ICD-10-CM

## 2024-04-23 DIAGNOSIS — Z87891 Personal history of nicotine dependence: Secondary | ICD-10-CM

## 2024-04-23 DIAGNOSIS — Z122 Encounter for screening for malignant neoplasm of respiratory organs: Secondary | ICD-10-CM

## 2024-04-27 ENCOUNTER — Other Ambulatory Visit: Payer: Self-pay | Admitting: Family Medicine

## 2024-04-27 DIAGNOSIS — K219 Gastro-esophageal reflux disease without esophagitis: Secondary | ICD-10-CM

## 2024-04-28 ENCOUNTER — Ambulatory Visit: Admitting: Family Medicine

## 2024-04-28 ENCOUNTER — Encounter: Payer: Self-pay | Admitting: Family Medicine

## 2024-04-28 VITALS — BP 123/78 | HR 72 | Ht 66.5 in | Wt 159.0 lb

## 2024-04-28 DIAGNOSIS — E782 Mixed hyperlipidemia: Secondary | ICD-10-CM | POA: Diagnosis not present

## 2024-04-28 DIAGNOSIS — H6504 Acute serous otitis media, recurrent, right ear: Secondary | ICD-10-CM

## 2024-04-28 DIAGNOSIS — E119 Type 2 diabetes mellitus without complications: Secondary | ICD-10-CM

## 2024-04-28 DIAGNOSIS — H6590 Unspecified nonsuppurative otitis media, unspecified ear: Secondary | ICD-10-CM | POA: Insufficient documentation

## 2024-04-28 DIAGNOSIS — K529 Noninfective gastroenteritis and colitis, unspecified: Secondary | ICD-10-CM

## 2024-04-28 DIAGNOSIS — Z794 Long term (current) use of insulin: Secondary | ICD-10-CM

## 2024-04-28 DIAGNOSIS — E559 Vitamin D deficiency, unspecified: Secondary | ICD-10-CM | POA: Diagnosis not present

## 2024-04-28 DIAGNOSIS — E038 Other specified hypothyroidism: Secondary | ICD-10-CM

## 2024-04-28 DIAGNOSIS — R300 Dysuria: Secondary | ICD-10-CM | POA: Diagnosis not present

## 2024-04-28 DIAGNOSIS — R7301 Impaired fasting glucose: Secondary | ICD-10-CM

## 2024-04-28 MED ORDER — OFLOXACIN 0.3 % OT SOLN: OTIC | 0 refills | Status: AC

## 2024-04-28 NOTE — Assessment & Plan Note (Signed)
-  GI stool studies have been ordered for further evaluation. -Encourage hydration with electrolyte-containing fluids such as Pedialyte or Gatorade Zero. -Recommend following the BRAT diet (bananas, rice, applesauce, toast) for the next 24-48 hours, then gradually reintroduce regular foods. -Advise the patient to avoid dairy, high-fat, greasy, and spicy foods until symptoms improve. -Imodium (loperamide) may be used for symptom relief only if stool studies return negative for infection. -Recommend fiber supplementation (e.g., Benefiber, Metamucil) once the acute loose stools begin to improve.

## 2024-04-28 NOTE — Patient Instructions (Addendum)
 I appreciate the opportunity to provide care to you today!    Follow up:  5 months  Labs: please stop by the lab today to get your blood drawn (CBC, CMP, TSH, Lipid profile, HgA1c, Vit D)  Diarrhea -GI stool studies have been ordered for further evaluation. -Encourage hydration with electrolyte-containing fluids such as Pedialyte or Gatorade Zero. -Recommend following the BRAT diet (bananas, rice, applesauce, toast) for the next 24-48 hours, then gradually reintroduce regular foods. -Advise the patient to avoid dairy, high-fat, greasy, and spicy foods until symptoms improve. -Imodium (loperamide) may be used for symptom relief only if stool studies return negative for infection. -Recommend fiber supplementation (e.g., Benefiber, Metamucil) once the acute loose stools begin to improve.  Please seek immediate medical care if experiencing: -Bloody stools -Severe abdominal pain -Persistent vomiting -Fever -Signs of dehydration (dizziness, low urine output)  Serous Otitis Will treat for suspected serous otitis. Continue Xyzal  and Flonase  nasal spray as previously prescribed to help reduce sinus-related congestion contributing to ear pressure. Recommend warm compresses over the affected ear for symptom relief.  Symptom Management -May use Tylenol or ibuprofen as needed for ear pain. -Encourage frequent swallowing, chewing gum, or yawning to help equalize ear pressure. -Advise avoiding q-tips, ear flushing, or inserting objects into the ear.  Non-Pharmacological Recommendations -Encourage steam inhalation or warm showers to help relieve sinus congestion. -Elevate the head during sleep to help reduce ear fullness and pressure.  Red Flag Symptoms Seek urgent care if experiencing: Fever Worsening ear pain New hearing loss Ear drainage Severe dizziness or vertigo  Follow-Up Follow up in 7-10 days if no improvement or sooner if symptoms worsen.    Please continue to a  heart-healthy diet and increase your physical activities. Try to exercise for at least five days a week.    It was a pleasure to see you and I look forward to continuing to work together on your health and well-being. Please do not hesitate to call the office if you need care or have questions about your care.  In case of emergency, please visit the Emergency Department for urgent care, or contact our clinic at [phone] to schedule an appointment. We're here to help you!   Have a wonderful day and week. With Gratitude, Meade JENEANE Gerlach MSN, FNP-BC, PMHNP-BC

## 2024-04-28 NOTE — Progress Notes (Signed)
 Established Patient Office Visit  Subjective:  Patient ID: Brittney Reyes, female    DOB: Sep 25, 1970  Age: 53 y.o. MRN: [id number]  CC:  Chief Complaint  Patient presents with   Hypertension    Follow up    ears    Check ears, feels like there is fluid in them   Diarrhea    Constant watery stools    HPI Brittney Reyes is a 53 y.o. female with past medical history of Hypertension, type 2 diabetes, GERD, IBS-D presents for f/u of  chronic medical conditions.  The patient presents today with a past medical history significant for chronic diarrhea and IBS-C. She reports that over the past few weeks she has been experiencing loose stools, averaging 7-8 bowel movements per week. Symptoms began with greasy, loose stools which later became firmer but still loose in consistency. She reports no bowel movements today, but notes that she has not eaten. She denies fever and has no reports of abdominal pain, blood in the stool, nausea, or vomiting. No recent travel, antibiotic use, or dietary changes reported.  The patient, with a history of chronic sinusitis currently managed with Xyzal  and Flonase  nasal spray, presents with right ear pain for the past 4 days. She describes the sensation as if water is trapped in the affected ear and reports that the ear feels wet constantly, though no actual drainage is present. She denies fever, hearing loss, or visible discharge. No recent swimming, upper respiratory infection, or trauma to the ear reported.  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)   TONSILLECTOMY      Family History  Problem Relation Age of Onset   Diabetes Mother    Hypertension Mother    Hypertension Father    Migraines Daughter    Asthma Child    Colon cancer Neg Hx    Colon polyps Neg Hx     Social History   Socioeconomic History   Marital status: Married    Spouse name: Not on file   Number of children: 6   Years of education: Not on file   Highest education level: Associate degree: occupational, scientist, product/process development, or vocational program  Occupational History   Not on file  Tobacco Use   Smoking status: Every Day    Current packs/day: 1.00    Average packs/day: 1 pack/day for 37.2 years (37.2 ttl pk-yrs)    Types: Cigarettes    Start date: 01/30/1987   Smokeless tobacco: Never   Tobacco comments:    Quit   Vaping Use   Vaping status: Never Used  Substance and Sexual Activity   Alcohol use: No   Drug use: No   Sexual activity: Yes    Birth control/protection: Surgical    Comment: Tubal  Other Topics Concern   Not on file  Social History Narrative   Not on file  Social Drivers of Health   Financial Resource Strain: Medium Risk (04/25/2024)   Overall Financial Resource Strain (CARDIA)    Difficulty of Paying Living Expenses: Somewhat hard  Food Insecurity: No Food Insecurity (04/25/2024)   Hunger Vital Sign    Worried About Running Out of Food in the Last Year: Never true    Ran Out of Food in the Last Year: Never true  Transportation Needs: No Transportation Needs (04/25/2024)   PRAPARE - Administrator, Civil Service (Medical): No    Lack of Transportation (Non-Medical): No  Physical Activity: Insufficiently Active (04/25/2024)   Exercise Vital Sign    Days of Exercise per Week: 2 days    Minutes of Exercise per Session: 60 min  Stress: Stress Concern Present (04/25/2024)   [name] of Occupational Health - Occupational  Stress Questionnaire    Feeling of Stress: To some extent  Social Connections: Socially Integrated (04/25/2024)   Social Connection and Isolation Panel    Frequency of Communication with Friends and Family: Three times a week    Frequency of Social Gatherings with Friends and Family: Twice a week    Attends Religious Services: More than 4 times per year    Active Member of [employer] or Organizations: Yes    Attends Engineer, Structural: More than 4 times per year    Marital Status: Married  Catering Manager Violence: Not At Risk (03/19/2024)   Humiliation, Afraid, Rape, and Kick questionnaire    Fear of Current or Ex-Partner: No    Emotionally Abused: No    Physically Abused: No    Sexually Abused: No    Outpatient Medications Prior to Visit  Medication Sig Dispense Refill   acetaminophen (TYLENOL) 500 MG tablet Take 1,000 mg by mouth daily as needed for headache.     Blood Glucose Monitoring Suppl DEVI 1 each by Does not apply route in the morning, at noon, and at bedtime. May substitute to any manufacturer covered by patient's insurance. (Patient not taking: Reported on 03/19/2024) 1 each 0   cyclobenzaprine  (FLEXERIL ) 5 MG tablet Take 1 tablet (5 mg total) by mouth at bedtime. 30 tablet 1   dicyclomine  (BENTYL ) 20 MG tablet Take 1 tablet (20 mg total) by mouth 2 (two) times daily. (Patient not taking: Reported on 03/19/2024) 20 tablet 0   fluticasone  (FLONASE ) 50 MCG/ACT nasal spray Place 2 sprays into both nostrils daily. 16 g 6   hydrOXYzine  (ATARAX ) 25 MG tablet Take 1 tablet (25 mg total) by mouth every 6 (six) hours as needed for itching. 12 tablet 0   levocetirizine (XYZAL ) 5 MG tablet TAKE ONE TABLET BY MOUTH IN THE EVENING 60 tablet 1   meclizine  (ANTIVERT ) 25 MG tablet Take 1 tablet (25 mg total) by mouth 3 (three) times daily. 270 tablet 3   metFORMIN  (GLUCOPHAGE ) 500 MG tablet Take 2 tablets (1,000 mg total) by mouth daily. 180 tablet 3   metoprolol  succinate (TOPROL -XL)  25 MG 24 hr tablet Take 1 tablet (25 mg total) by mouth daily. 90 tablet 1   ondansetron  (ZOFRAN -ODT) 4 MG disintegrating tablet Take 1 tablet (4 mg total) by mouth every 8 (eight) hours as needed for nausea or vomiting. 20 tablet 0   PARoxetine  (PAXIL ) 10 MG tablet Take 1 tablet (10 mg total) by mouth daily. (Patient not taking: Reported on 03/19/2024) 60 tablet 1   Relugolix-Estradiol-Norethind (MYFEMBREE ) 40-1-0.5 MG TABS Take 1 tablet by mouth daily. 30 tablet 11   rosuvastatin  (  CRESTOR ) 10 MG tablet Take 1 tablet (10 mg total) by mouth daily. 90 tablet 3   Safety Lancets 28G MISC Use to check blood sugar up to three times daily DX e11.65 (Patient not taking: Reported on 03/19/2024) 100 each 5   triamterene -hydrochlorothiazide (MAXZIDE-25) 37.5-25 MG tablet Take 1 tablet by mouth daily. 90 tablet 2   TRULICITY  3 MG/0.5ML SOAJ INJECT 3MG  INTO THE SKIN ONCE WEEKLY 2 mL 0   Vitamin D , Ergocalciferol , (DRISDOL ) 1.25 MG (50000 UNIT) CAPS capsule Take 1 capsule (50,000 Units total) by mouth every 7 (seven) days. (Patient not taking: Reported on 03/19/2024) 27 capsule 1   omeprazole  (PRILOSEC) 20 MG capsule TAKE ONE CAPSULE BY MOUTH ONCE DAILY 60 capsule 1   No facility-administered medications prior to visit.    Allergies  Allergen Reactions   Duloxetine Hives   Fluoxetine Nausea And Vomiting   Lactose Intolerance (Gi) Cough, Diarrhea, Hives, Itching, Palpitations, Rash, Shortness Of Breath and Swelling   Milk (Cow) Hives   Gabapentin Other (See Comments)    Dizziness and slurring of words on this medication   Prozac [Fluoxetine Hcl] Nausea And Vomiting and Swelling   Wellbutrin [Bupropion] Hives   Gluten Meal Itching    ROS Review of Systems  Constitutional:  Negative for chills and fever.  HENT:  Positive for ear pain.   Eyes:  Negative for visual disturbance.  Respiratory:  Negative for chest tightness and shortness of breath.   Gastrointestinal:  Positive for diarrhea.  Neurological:   Negative for dizziness and headaches.      Objective:    Physical Exam HENT:     Head: Normocephalic.     Right Ear: Tympanic membrane normal. No tenderness.     Mouth/Throat:     Mouth: Mucous membranes are moist.  Cardiovascular:     Rate and Rhythm: Normal rate.     Heart sounds: Normal heart sounds.  Pulmonary:     Effort: Pulmonary effort is normal.     Breath sounds: Normal breath sounds.  Neurological:     Mental Status: She is alert.     BP 123/78   Pulse 72   Ht 5' 6.5 (1.689 m)   Wt 159 lb (72.1 kg)   SpO2 98%   BMI 25.28 kg/m  Wt Readings from Last 3 Encounters:  04/28/24 159 lb (72.1 kg)  03/19/24 161 lb 4 oz (73.1 kg)  01/23/24 168 lb 0.6 oz (76.2 kg)    Lab Results  Component Value Date   TSH 1.370 03/19/2024   Lab Results  Component Value Date   WBC 8.8 12/18/2023   HGB 10.4 (L) 12/18/2023   HCT 34.0 (L) 12/18/2023   MCV 75.2 (L) 12/18/2023   PLT 480 (H) 12/18/2023   Lab Results  Component Value Date   NA 139 03/19/2024   K 3.6 03/19/2024   CO2 23 03/19/2024   GLUCOSE 88 03/19/2024   BUN 7 03/19/2024   CREATININE 0.81 03/19/2024   BILITOT 0.2 03/19/2024   ALKPHOS 81 03/19/2024   AST 19 03/19/2024   ALT 18 03/19/2024   PROT 7.2 03/19/2024   ALBUMIN 4.8 03/19/2024   CALCIUM  9.7 03/19/2024   ANIONGAP 12 12/18/2023   EGFR 88 03/19/2024   Lab Results  Component Value Date   CHOL 113 09/20/2023   Lab Results  Component Value Date   HDL 42 09/20/2023   Lab Results  Component Value Date   LDLCALC 49 09/20/2023   Lab Results  Component  Value Date   TRIG 126 09/20/2023   Lab Results  Component Value Date   CHOLHDL 2.7 09/20/2023   Lab Results  Component Value Date   HGBA1C 7.2 (H) 09/20/2023      Assessment & Plan:  Chronic diarrhea Assessment & Plan: -GI stool studies have been ordered for further evaluation. -Encourage hydration with electrolyte-containing fluids such as Pedialyte or Gatorade Zero. -Recommend  following the BRAT diet (bananas, rice, applesauce, toast) for the next 24-48 hours, then gradually reintroduce regular foods. -Advise the patient to avoid dairy, high-fat, greasy, and spicy foods until symptoms improve. -Imodium (loperamide) may be used for symptom relief only if stool studies return negative for infection. -Recommend fiber supplementation (e.g., Benefiber, Metamucil) once the acute loose stools begin to improve.  Orders: -     GI Profile, Stool, PCR  Recurrent acute serous otitis media of right ear Assessment & Plan: Will treat for suspected serous otitis. Continue Xyzal  and Flonase  nasal spray as previously prescribed to help reduce sinus-related congestion contributing to ear pressure. Recommend warm compresses over the affected ear for symptom relief.   Orders: -     Ofloxacin; Place 5 drops into the right ear twice daily for 7 days  Dispense: 5 mL; Refill: 0  Dysuria -     Urinalysis  Type 2 diabetes mellitus without complication, with long-term current use of insulin (HCC) -     Microalbumin / creatinine urine ratio  IFG (impaired fasting glucose) -     Hemoglobin A1c  Vitamin D  deficiency -     VITAMIN D  25 Hydroxy (Vit-D Deficiency, Fractures)  TSH (thyroid -stimulating hormone deficiency) -     TSH + free T4  Mixed hyperlipidemia -     Lipid panel -     CMP14+EGFR -     CBC with Differential/Platelet  Note: This chart has been completed using Engineer, Civil (consulting) software, and while attempts have been made to ensure accuracy, certain words and phrases may not be transcribed as intended.    Follow-up: Return in about 5 months (around 09/26/2024).   Meital Riehl  Z Bacchus, FNP

## 2024-04-28 NOTE — Assessment & Plan Note (Signed)
 Will treat for suspected serous otitis. Continue Xyzal  and Flonase  nasal spray as previously prescribed to help reduce sinus-related congestion contributing to ear pressure. Recommend warm compresses over the affected ear for symptom relief.

## 2024-04-29 LAB — CBC WITH DIFFERENTIAL/PLATELET
Basophils Absolute: 0 x10E3/uL (ref 0.0–0.2)
Basos: 1 %
EOS (ABSOLUTE): 0.1 x10E3/uL (ref 0.0–0.4)
Eos: 2 %
Hematocrit: 34.9 % (ref 34.0–46.6)
Hemoglobin: 10.5 g/dL — ABNORMAL LOW (ref 11.1–15.9)
Immature Grans (Abs): 0 x10E3/uL (ref 0.0–0.1)
Immature Granulocytes: 0 %
Lymphocytes Absolute: 2.7 x10E3/uL (ref 0.7–3.1)
Lymphs: 34 %
MCH: 23.9 pg — ABNORMAL LOW (ref 26.6–33.0)
MCHC: 30.1 g/dL — ABNORMAL LOW (ref 31.5–35.7)
MCV: 80 fL (ref 79–97)
Monocytes Absolute: 0.5 x10E3/uL (ref 0.1–0.9)
Monocytes: 7 %
Neutrophils Absolute: 4.4 x10E3/uL (ref 1.4–7.0)
Neutrophils: 56 %
Platelets: 479 x10E3/uL — ABNORMAL HIGH (ref 150–450)
RBC: 4.39 x10E6/uL (ref 3.77–5.28)
RDW: 16.5 % — ABNORMAL HIGH (ref 11.7–15.4)
WBC: 7.8 x10E3/uL (ref 3.4–10.8)

## 2024-04-29 LAB — TSH+FREE T4
Free T4: 1.01 ng/dL (ref 0.82–1.77)
TSH: 1.64 u[IU]/mL (ref 0.450–4.500)

## 2024-04-29 LAB — CMP14+EGFR
ALT: 9 IU/L (ref 0–32)
AST: 12 IU/L (ref 0–40)
Albumin: 4.6 g/dL (ref 3.8–4.9)
Alkaline Phosphatase: 64 IU/L (ref 49–135)
BUN/Creatinine Ratio: 14 (ref 9–23)
BUN: 10 mg/dL (ref 6–24)
Bilirubin Total: <0.2 mg/dL (ref 0.0–1.2)
CO2: 24 mmol/L (ref 20–29)
Calcium: 9.4 mg/dL (ref 8.7–10.2)
Chloride: 102 mmol/L (ref 96–106)
Creatinine, Ser: 0.7 mg/dL (ref 0.57–1.00)
Globulin, Total: 2.3 g/dL (ref 1.5–4.5)
Glucose: 115 mg/dL — ABNORMAL HIGH (ref 70–99)
Potassium: 3.9 mmol/L (ref 3.5–5.2)
Sodium: 141 mmol/L (ref 134–144)
Total Protein: 6.9 g/dL (ref 6.0–8.5)
eGFR: 105 mL/min/1.73 (ref >59)

## 2024-04-29 LAB — LIPID PANEL
Chol/HDL Ratio: 4.4 ratio (ref 0.0–4.4)
Cholesterol, Total: 211 mg/dL — ABNORMAL HIGH (ref 100–199)
HDL: 48 mg/dL (ref >39)
LDL Chol Calc (NIH): 125 mg/dL — ABNORMAL HIGH (ref 0–99)
Triglycerides: 213 mg/dL — ABNORMAL HIGH (ref 0–149)
VLDL Cholesterol Cal: 38 mg/dL (ref 5–40)

## 2024-04-29 LAB — HEMOGLOBIN A1C
Est. average glucose Bld gHb Est-mCnc: 143 mg/dL
Hgb A1c MFr Bld: 6.6 % — ABNORMAL HIGH (ref 4.8–5.6)

## 2024-04-29 LAB — VITAMIN D 25 HYDROXY (VIT D DEFICIENCY, FRACTURES): Vit D, 25-Hydroxy: 16.4 ng/mL — ABNORMAL LOW (ref 30.0–100.0)

## 2024-05-01 ENCOUNTER — Encounter: Payer: Self-pay | Admitting: Internal Medicine

## 2024-05-01 ENCOUNTER — Encounter: Payer: Self-pay | Admitting: Family Medicine

## 2024-05-01 LAB — GI PROFILE, STOOL, PCR

## 2024-05-01 LAB — MICROALBUMIN / CREATININE URINE RATIO
Creatinine, Urine: 91 mg/dL
Microalb/Creat Ratio: 4 mg/g{creat} (ref 0–29)
Microalbumin, Urine: 3.5 ug/mL

## 2024-05-02 ENCOUNTER — Telehealth: Payer: Self-pay

## 2024-05-02 NOTE — Telephone Encounter (Signed)
 error

## 2024-05-03 ENCOUNTER — Ambulatory Visit: Payer: Self-pay | Admitting: Family Medicine

## 2024-05-03 DIAGNOSIS — E559 Vitamin D deficiency, unspecified: Secondary | ICD-10-CM

## 2024-05-03 DIAGNOSIS — E782 Mixed hyperlipidemia: Secondary | ICD-10-CM

## 2024-05-03 MED ORDER — VITAMIN D (ERGOCALCIFEROL) 1.25 MG (50000 UNIT) PO CAPS: 50000.0000 [IU] | ORAL_CAPSULE | ORAL | 1 refills | Status: AC

## 2024-05-04 MED ORDER — ROSUVASTATIN CALCIUM 20 MG PO TABS
20.0000 mg | ORAL_TABLET | Freq: Every day | ORAL | 3 refills | Status: AC
Start: 2024-05-04 — End: ?

## 2024-05-05 ENCOUNTER — Encounter: Payer: Self-pay | Admitting: Family Medicine

## 2024-05-21 ENCOUNTER — Ambulatory Visit: Payer: Self-pay

## 2024-05-21 DIAGNOSIS — K642 Third degree hemorrhoids: Secondary | ICD-10-CM | POA: Diagnosis not present

## 2024-05-21 DIAGNOSIS — R030 Elevated blood-pressure reading, without diagnosis of hypertension: Secondary | ICD-10-CM | POA: Diagnosis not present

## 2024-05-21 DIAGNOSIS — K580 Irritable bowel syndrome with diarrhea: Secondary | ICD-10-CM | POA: Diagnosis not present

## 2024-05-21 DIAGNOSIS — K644 Residual hemorrhoidal skin tags: Secondary | ICD-10-CM | POA: Diagnosis not present

## 2024-05-21 NOTE — Telephone Encounter (Signed)
 FYI Only or Action Required?: FYI only for provider: appointment scheduled on 05/22/24.  Patient was last seen in primary care on 04/28/2024 by Edman Meade PEDLAR, FNP.  Called Nurse Triage reporting Hemorrhoids.  Symptoms began a week ago.  Interventions attempted: OTC medications: prep H, witch hazel pads.  Symptoms are: unchanged.  Triage Disposition: See PCP When Office is Open (Within 3 Days)  Patient/caregiver understands and will follow disposition?: Yes Reason for Disposition  Nursing judgment or information in reference  Answer Assessment - Initial Assessment Questions Patient reports having IBS with constipation but now has diarrhea. Denies constipation or straining to use the bathroom. GI cannot get her in until 07/10/24.  1. REASON FOR CALL: What is your main concern right now?     Multiple hemorrhoids  2. ONSET: When did the hemorrhoids start?     Last week  3. SEVERITY: How bad is the hemorrhoids?     Feels like they are pushing through the vagina   4. TREATMENTS AND RESPONSE: What have you done so far to try to make this better? What medicines have you used?     Tried preparation H and witch hazel pads  Protocols used: No Guideline Available-[facility]  Copied from CRM #[id number]. Topic: Clinical - Red Word Triage >> May 21, 2024 10:48 AM Winona R wrote: bad Hemorrhoid, medication is not working, pt states they started opff small and they are now bigger, she has been trying to push them back in. She states they're very bad. Also experiencing burning and irriitation

## 2024-05-21 NOTE — Telephone Encounter (Signed)
Noted patient scheduled

## 2024-05-22 ENCOUNTER — Ambulatory Visit: Admitting: Family Medicine

## 2024-05-28 ENCOUNTER — Other Ambulatory Visit: Payer: Self-pay | Admitting: Family Medicine

## 2024-05-28 DIAGNOSIS — J301 Allergic rhinitis due to pollen: Secondary | ICD-10-CM

## 2024-06-19 ENCOUNTER — Other Ambulatory Visit: Payer: Self-pay | Admitting: Family Medicine

## 2024-06-19 DIAGNOSIS — E782 Mixed hyperlipidemia: Secondary | ICD-10-CM

## 2024-06-19 MED ORDER — EZETIMIBE 10 MG PO TABS: 10.0000 mg | ORAL_TABLET | Freq: Every day | ORAL | 3 refills | Status: AC

## 2024-07-10 ENCOUNTER — Ambulatory Visit: Admitting: Gastroenterology

## 2024-07-14 ENCOUNTER — Ambulatory Visit: Admitting: Obstetrics & Gynecology

## 2024-07-14 ENCOUNTER — Encounter: Payer: Self-pay | Admitting: Obstetrics & Gynecology

## 2024-07-22 ENCOUNTER — Ambulatory Visit: Admitting: Obstetrics & Gynecology

## 2024-09-26 ENCOUNTER — Ambulatory Visit: Admitting: Family Medicine
